# Patient Record
Sex: Female | Born: 1975 | Race: White | Hispanic: No | Marital: Married | Smoking: Former smoker
Health system: Southern US, Community
[De-identification: ages and names within clinical notes are randomized; demographics above are authoritative.]

## PROBLEM LIST (undated history)

## (undated) ENCOUNTER — Inpatient Hospital Stay (HOSPITAL_COMMUNITY): Payer: Self-pay

## (undated) DIAGNOSIS — D649 Anemia, unspecified: Secondary | ICD-10-CM

## (undated) HISTORY — PX: WISDOM TOOTH EXTRACTION: SHX21

## (undated) HISTORY — PX: TONSILLECTOMY: SUR1361

---

## 2010-01-26 ENCOUNTER — Ambulatory Visit (HOSPITAL_COMMUNITY)
Admission: RE | Admit: 2010-01-26 | Discharge: 2010-01-26 | Payer: Self-pay | Source: Home / Self Care | Admitting: Obstetrics and Gynecology

## 2010-03-06 NOTE — L&D Delivery Note (Signed)
Delivery Note 2000 - C/C/+2 2010 - start active 2nd stage. Pushing in SF and L lateral, CNM continuous support at BS FHR 2nd stage Cat 1-2, BL 155, variable decels w/ nadir 90-100  At 9:00 PM a viable female was delivered via Vaginal, Spontaneous Delivery (Presentation: Left Occiput Anterior).  APGAR: 8, 8; weight 7 lb 2.3 oz (3240 g).   Placenta status: Intact, Spontaneous. Placenta to pathology - infant NICU admit.  Cytotec 800 mch PR given preventively for long labor and chronic IDA. Cord: 3 vessels with the following complications: Short.  Cord pH: 7.23 Cord blood donation collected. Cord clamped and cut by FOB.   Anesthesia: Epidural  Episiotomy: None Lacerations: 2nd degree Suture Repair: 3.0 vicryl vicryl rapide Est. Blood Loss (mL): 300  Mom to postpartum.  Baby to NICU - retractions.  Nicanor Mendolia 02/05/2011, 9:53 PM

## 2010-07-15 LAB — ABO/RH

## 2010-07-15 LAB — HEPATITIS B SURFACE ANTIGEN: Hepatitis B Surface Ag: NEGATIVE

## 2010-07-15 LAB — RUBELLA ANTIBODY, IGM: Rubella: IMMUNE

## 2010-07-15 LAB — RPR: RPR: NONREACTIVE

## 2010-11-03 ENCOUNTER — Inpatient Hospital Stay (HOSPITAL_COMMUNITY): Payer: BC Managed Care – PPO

## 2010-11-03 ENCOUNTER — Inpatient Hospital Stay (HOSPITAL_COMMUNITY)
Admission: AD | Admit: 2010-11-03 | Discharge: 2010-11-03 | Disposition: A | Discharge status: Home / Self Care | Payer: BC Managed Care – PPO | Source: Ambulatory Visit | Attending: Obstetrics and Gynecology | Admitting: Obstetrics and Gynecology

## 2010-11-03 ENCOUNTER — Encounter (HOSPITAL_COMMUNITY): Payer: Self-pay | Admitting: *Deleted

## 2010-11-03 DIAGNOSIS — R319 Hematuria, unspecified: Secondary | ICD-10-CM | POA: Diagnosis present

## 2010-11-03 DIAGNOSIS — N200 Calculus of kidney: Secondary | ICD-10-CM

## 2010-11-03 DIAGNOSIS — O26839 Pregnancy related renal disease, unspecified trimester: Secondary | ICD-10-CM | POA: Insufficient documentation

## 2010-11-03 LAB — URINALYSIS, ROUTINE W REFLEX MICROSCOPIC
Ketones, ur: NEGATIVE mg/dL
Nitrite: NEGATIVE
Protein, ur: NEGATIVE mg/dL
pH: 6.5 (ref 5.0–8.0)

## 2010-11-03 LAB — WET PREP, GENITAL: Yeast Wet Prep HPF POC: NONE SEEN

## 2010-11-03 LAB — URINE MICROSCOPIC-ADD ON

## 2010-11-03 MED ORDER — SULFAMETHOXAZOLE-TMP DS 800-160 MG PO TABS
1.0000 | ORAL_TABLET | Freq: Two times a day (BID) | ORAL | Status: DC
  Administered 2010-11-03: 1 via ORAL
  Filled 2010-11-03: qty 1

## 2010-11-03 MED ORDER — SULFAMETHOXAZOLE-TMP DS 800-160 MG PO TABS: 1.0000 | ORAL_TABLET | Freq: Two times a day (BID) | ORAL | Status: AC

## 2010-11-03 NOTE — ED Provider Notes (Signed)
History   SUBJECTIVE:  Destiny Wells is a 34 y.o. female G1P0 at [redacted]w[redacted]d who presents with sudden onset of R flank pain and blood in urine. Noted blood early morning when going to bathroom, some pelvic discomfort, but denies contractions. Good fetal movement. Rates pain 4/10 in her back, now mostly gone. Denies fever, chills. No hx of kidney stones or pyelo, but strong FHx of kidney stones (brother, father). Denies dysuria or frequency.  Chief Complaint  Patient presents with  . Hematuria   No past surgical history on file.  No family history on file.  History  Substance Use Topics  . Smoking status: Not on file  . Smokeless tobacco: Not on file  . Alcohol Use: Not on file    Allergies: No Known Allergies  Prescriptions prior to admission  Medication Sig Dispense Refill  . prenatal vitamin w/FE, FA (PRENATAL 1 + 1) 27-1 MG TABS Take 1 tablet by mouth daily.          ROS neg except as noted. Physical Exam   Blood pressure 128/75, pulse 73, temperature 98.3 F (36.8 C), temperature source Oral, resp. rate 20, height 5\' 7"  (1.702 m), weight 138 lb 8 oz (62.823 kg), last menstrual period 05/13/2010. FHR: 150 BPM, mod variability, accel's present, no decel's Toco: mild irritability with 20-30sec ctx noted on EFM, palpated mild, denies pain  Gen: AAOx3, NAD CV: RRR Pulm: CTAB Abd: soft, NT, gravid, S=D, no CVAT GU: SSE with nl mucous memb, no VB, moderate amt. White creamy D/C noted, wet prep collected  cvx closed/thick/high Ext: no edema   ED Course  UA with large blood, spec grav < 1.005 Renal sono with multiple calculi bilateral, largest stone in R kidney 0.38 x 0.27 x 0.37 cm and debris within bladder Single dilated calyx on left Diminished ureteral jet on right compared to left  MDM IUP at [redacted]w[redacted]d with hematuria kidney stones, no evidence of pyelo  UCX pending Plan d/c home as patient stable at this time, pain minimal Will continue to monitor, advised push PO fluids,  rest today Will give course of ABX Bactrim prophylactic  To call if fever, chills, pain Consult OB on call re further management Strain all urine F/U in office as scheduled or sooner if above develops.  Cassian Torelli 11/03/2010 6:35 AM

## 2010-11-03 NOTE — Progress Notes (Signed)
Pt states that she noticed she has blood in her urine-denies bleeding vaginally

## 2010-11-04 LAB — URINE CULTURE
Colony Count: NO GROWTH
Culture  Setup Time: 201208301043
Culture: NO GROWTH

## 2011-01-19 LAB — STREP B DNA PROBE: GBS: NEGATIVE

## 2011-02-05 ENCOUNTER — Encounter (HOSPITAL_COMMUNITY): Payer: Self-pay | Admitting: Anesthesiology

## 2011-02-05 ENCOUNTER — Inpatient Hospital Stay (HOSPITAL_COMMUNITY): Payer: BC Managed Care – PPO | Admitting: Anesthesiology

## 2011-02-05 ENCOUNTER — Other Ambulatory Visit: Payer: Self-pay

## 2011-02-05 ENCOUNTER — Inpatient Hospital Stay (HOSPITAL_COMMUNITY)
Admission: AD | Admit: 2011-02-05 | Discharge: 2011-02-08 | DRG: 373 | Disposition: A | Discharge status: Home / Self Care | Payer: BC Managed Care – PPO | Source: Ambulatory Visit | Attending: Obstetrics and Gynecology | Admitting: Obstetrics and Gynecology

## 2011-02-05 ENCOUNTER — Encounter (HOSPITAL_COMMUNITY): Payer: Self-pay | Admitting: *Deleted

## 2011-02-05 ENCOUNTER — Encounter (HOSPITAL_COMMUNITY): Payer: Self-pay | Admitting: Obstetrics

## 2011-02-05 DIAGNOSIS — O9903 Anemia complicating the puerperium: Secondary | ICD-10-CM | POA: Diagnosis not present

## 2011-02-05 DIAGNOSIS — D62 Acute posthemorrhagic anemia: Secondary | ICD-10-CM | POA: Diagnosis not present

## 2011-02-05 DIAGNOSIS — O09519 Supervision of elderly primigravida, unspecified trimester: Secondary | ICD-10-CM | POA: Diagnosis present

## 2011-02-05 LAB — CBC
HCT: 29.3 % — ABNORMAL LOW (ref 36.0–46.0)
MCH: 30.1 pg (ref 26.0–34.0)
MCHC: 34.5 g/dL (ref 30.0–36.0)
MCV: 87.5 fL (ref 78.0–100.0)
RDW: 13.8 % (ref 11.5–15.5)

## 2011-02-05 MED ORDER — ONDANSETRON HCL 4 MG/2ML IJ SOLN: 4.0000 mg | INTRAMUSCULAR | Status: DC | PRN

## 2011-02-05 MED ORDER — FLEET ENEMA 7-19 GM/118ML RE ENEM: 1.0000 | ENEMA | Freq: Every day | RECTAL | Status: DC | PRN

## 2011-02-05 MED ORDER — FLEET ENEMA 7-19 GM/118ML RE ENEM: 1.0000 | ENEMA | RECTAL | Status: DC | PRN

## 2011-02-05 MED ORDER — EPHEDRINE 5 MG/ML INJ: 10.0000 mg | INTRAVENOUS | Status: DC | PRN

## 2011-02-05 MED ORDER — BENZOCAINE-MENTHOL 20-0.5 % EX AERO
1.0000 "application " | INHALATION_SPRAY | CUTANEOUS | Status: DC | PRN
  Administered 2011-02-06: 1 via TOPICAL

## 2011-02-05 MED ORDER — ONDANSETRON HCL 4 MG PO TABS: 4.0000 mg | ORAL_TABLET | ORAL | Status: DC | PRN

## 2011-02-05 MED ORDER — LIDOCAINE HCL (PF) 1 % IJ SOLN
30.0000 mL | INTRAMUSCULAR | Status: DC | PRN
  Filled 2011-02-05: qty 30

## 2011-02-05 MED ORDER — FENTANYL 2.5 MCG/ML BUPIVACAINE 1/10 % EPIDURAL INFUSION (WH - ANES)
INTRAMUSCULAR | Status: DC | PRN
  Administered 2011-02-05: 14 mL/h via EPIDURAL

## 2011-02-05 MED ORDER — PHENYLEPHRINE 40 MCG/ML (10ML) SYRINGE FOR IV PUSH (FOR BLOOD PRESSURE SUPPORT): 80.0000 ug | PREFILLED_SYRINGE | INTRAVENOUS | Status: DC | PRN

## 2011-02-05 MED ORDER — TETANUS-DIPHTH-ACELL PERTUSSIS 5-2.5-18.5 LF-MCG/0.5 IM SUSP
0.5000 mL | Freq: Once | INTRAMUSCULAR | Status: DC
  Filled 2011-02-05: qty 0.5

## 2011-02-05 MED ORDER — IBUPROFEN 600 MG PO TABS
600.0000 mg | ORAL_TABLET | Freq: Four times a day (QID) | ORAL | Status: DC
  Administered 2011-02-06 – 2011-02-08 (×9): 600 mg via ORAL
  Filled 2011-02-05 (×9): qty 1

## 2011-02-05 MED ORDER — OXYTOCIN 20 UNITS IN LACTATED RINGERS INFUSION - SIMPLE: 1.0000 m[IU]/min | INTRAVENOUS | Status: DC

## 2011-02-05 MED ORDER — DIBUCAINE 1 % RE OINT: 1.0000 "application " | TOPICAL_OINTMENT | RECTAL | Status: DC | PRN

## 2011-02-05 MED ORDER — OXYTOCIN 20 UNITS IN LACTATED RINGERS INFUSION - SIMPLE
125.0000 mL/h | Freq: Once | INTRAVENOUS | Status: AC
  Administered 2011-02-05: 999 mL/h via INTRAVENOUS

## 2011-02-05 MED ORDER — ACETAMINOPHEN 325 MG PO TABS: 650.0000 mg | ORAL_TABLET | ORAL | Status: DC | PRN

## 2011-02-05 MED ORDER — ZOLPIDEM TARTRATE 5 MG PO TABS
5.0000 mg | ORAL_TABLET | Freq: Every evening | ORAL | Status: DC | PRN
Start: 2011-02-05 — End: 2011-02-08

## 2011-02-05 MED ORDER — OXYCODONE-ACETAMINOPHEN 5-325 MG PO TABS
1.0000 | ORAL_TABLET | ORAL | Status: DC | PRN
  Administered 2011-02-06: 1 via ORAL
  Filled 2011-02-05: qty 1

## 2011-02-05 MED ORDER — TERBUTALINE SULFATE 1 MG/ML IJ SOLN: 0.2500 mg | Freq: Once | INTRAMUSCULAR | Status: DC | PRN

## 2011-02-05 MED ORDER — PHENYLEPHRINE 40 MCG/ML (10ML) SYRINGE FOR IV PUSH (FOR BLOOD PRESSURE SUPPORT)
80.0000 ug | PREFILLED_SYRINGE | INTRAVENOUS | Status: DC | PRN
  Filled 2011-02-05: qty 5

## 2011-02-05 MED ORDER — MISOPROSTOL 200 MCG PO TABS: 800.0000 ug | ORAL_TABLET | Freq: Once | ORAL | Status: DC

## 2011-02-05 MED ORDER — LACTATED RINGERS IV SOLN
500.0000 mL | INTRAVENOUS | Status: DC | PRN
  Administered 2011-02-05: 1000 mL via INTRAVENOUS

## 2011-02-05 MED ORDER — DIPHENHYDRAMINE HCL 50 MG/ML IJ SOLN: 12.5000 mg | INTRAMUSCULAR | Status: DC | PRN

## 2011-02-05 MED ORDER — ONDANSETRON HCL 4 MG/2ML IJ SOLN: 4.0000 mg | Freq: Four times a day (QID) | INTRAMUSCULAR | Status: DC | PRN

## 2011-02-05 MED ORDER — LANOLIN HYDROUS EX OINT: TOPICAL_OINTMENT | CUTANEOUS | Status: DC | PRN

## 2011-02-05 MED ORDER — SENNOSIDES-DOCUSATE SODIUM 8.6-50 MG PO TABS
2.0000 | ORAL_TABLET | Freq: Every day | ORAL | Status: DC
  Administered 2011-02-06 – 2011-02-07 (×2): 2 via ORAL

## 2011-02-05 MED ORDER — FENTANYL 2.5 MCG/ML BUPIVACAINE 1/10 % EPIDURAL INFUSION (WH - ANES)
14.0000 mL/h | INTRAMUSCULAR | Status: DC
  Administered 2011-02-05 (×2): 14 mL/h via EPIDURAL
  Filled 2011-02-05 (×3): qty 60

## 2011-02-05 MED ORDER — DIPHENHYDRAMINE HCL 25 MG PO CAPS: 25.0000 mg | ORAL_CAPSULE | Freq: Four times a day (QID) | ORAL | Status: DC | PRN

## 2011-02-05 MED ORDER — LACTATED RINGERS IV SOLN
INTRAVENOUS | Status: DC
  Administered 2011-02-05: 125 mL/h via INTRAVENOUS
  Administered 2011-02-05: 15:00:00 via INTRAVENOUS

## 2011-02-05 MED ORDER — EPHEDRINE 5 MG/ML INJ
10.0000 mg | INTRAVENOUS | Status: DC | PRN
  Filled 2011-02-05: qty 4

## 2011-02-05 MED ORDER — LACTATED RINGERS IV SOLN: 500.0000 mL | Freq: Once | INTRAVENOUS | Status: DC

## 2011-02-05 MED ORDER — PRENATAL PLUS 27-1 MG PO TABS
1.0000 | ORAL_TABLET | Freq: Every day | ORAL | Status: DC
  Administered 2011-02-06 – 2011-02-08 (×3): 1 via ORAL
  Filled 2011-02-05 (×3): qty 1

## 2011-02-05 MED ORDER — IBUPROFEN 600 MG PO TABS: 600.0000 mg | ORAL_TABLET | Freq: Four times a day (QID) | ORAL | Status: DC | PRN

## 2011-02-05 MED ORDER — SIMETHICONE 80 MG PO CHEW: 80.0000 mg | CHEWABLE_TABLET | ORAL | Status: DC | PRN

## 2011-02-05 MED ORDER — WITCH HAZEL-GLYCERIN EX PADS: 1.0000 "application " | MEDICATED_PAD | CUTANEOUS | Status: DC | PRN

## 2011-02-05 MED ORDER — OXYTOCIN 20 UNITS IN LACTATED RINGERS INFUSION - SIMPLE
1.0000 m[IU]/min | INTRAVENOUS | Status: DC
  Administered 2011-02-05: 1 m[IU]/min via INTRAVENOUS
  Filled 2011-02-05: qty 1000

## 2011-02-05 MED ORDER — LIDOCAINE HCL 1.5 % IJ SOLN
INTRAMUSCULAR | Status: DC | PRN
  Administered 2011-02-05 (×2): 4 mL via EPIDURAL

## 2011-02-05 MED ORDER — CITRIC ACID-SODIUM CITRATE 334-500 MG/5ML PO SOLN: 30.0000 mL | ORAL | Status: DC | PRN

## 2011-02-05 MED ORDER — OXYTOCIN BOLUS FROM INFUSION
500.0000 mL | Freq: Once | INTRAVENOUS | Status: DC
  Filled 2011-02-05: qty 500

## 2011-02-05 MED ORDER — BISACODYL 10 MG RE SUPP: 10.0000 mg | Freq: Every day | RECTAL | Status: DC | PRN

## 2011-02-05 MED ORDER — OXYCODONE-ACETAMINOPHEN 5-325 MG PO TABS: 2.0000 | ORAL_TABLET | ORAL | Status: DC | PRN

## 2011-02-05 MED ORDER — MISOPROSTOL 200 MCG PO TABS
ORAL_TABLET | ORAL | Status: AC
  Administered 2011-02-05: 800 ug via RECTAL
  Filled 2011-02-05: qty 4

## 2011-02-05 NOTE — Progress Notes (Signed)
Discussed MD recommendations for pitocin administration--pt refuses at this time to have labor augmentation measures performed

## 2011-02-05 NOTE — Anesthesia Procedure Notes (Signed)
Epidural Patient location during procedure: OB Start time: 02/05/2011 12:14 PM  Staffing Anesthesiologist: Abagale Boulos A. Performed by: anesthesiologist   Preanesthetic Checklist Completed: patient identified, site marked, surgical consent, pre-op evaluation, timeout performed, IV checked, risks and benefits discussed and monitors and equipment checked  Epidural Patient position: sitting Prep: site prepped and draped and DuraPrep Patient monitoring: continuous pulse ox and blood pressure Approach: midline Injection technique: LOR air  Needle:  Needle type: Tuohy  Needle gauge: 17 G Needle length: 9 cm Needle insertion depth: 4 cm Catheter type: closed end flexible Catheter size: 19 Gauge Catheter at skin depth: 9 cm Test dose: negative and 1.5% lidocaine  Assessment Events: blood not aspirated, injection not painful, no injection resistance, negative IV test and no paresthesia  Additional Notes Patient is more comfortable after epidural dosed. Please see RN's note for documentation of vital signs and FHR which are stable.

## 2011-02-05 NOTE — Progress Notes (Signed)
Destiny Wells is a 35 y.o. G1P0 at [redacted]w[redacted]d by LMP admitted for rupture of membranes at 1800 on 12/1.  Subjective: Painful contractions   Objective: BP 131/84  Pulse 81  Temp(Src) 98.1 F (36.7 C) (Oral)  Resp 20  LMP 05/13/2010      FHT:  FHR: 155 bpm, variability: moderate,  accelerations:  Present,  decelerations:  Absent UC:   irregular, every 3-7 minutes SVE:   Dilation: Fingertip Effacement (%): 70 Station: -2 Exam by:: Lilli Few, RN  Labs: Lab Results  Component Value Date   WBC 10.1 02/05/2011   HGB 10.1* 02/05/2011   HCT 29.3* 02/05/2011   MCV 87.5 02/05/2011   PLT 171 02/05/2011    Assessment / Plan: Spontaneous labor, progressing normally  Labor: Progressing normally Preeclampsia:  na Fetal Wellbeing:  Category I Pain Control:  Labor support without medications I/D:  n/a Anticipated MOD:  NSVD  Merrie Epler J 02/05/2011, 9:10 AM

## 2011-02-05 NOTE — Progress Notes (Signed)
Dr. Billy Coast notified of triage pt in L & D here for R/O SROM. Informed of +Fern, sve, fetal well being, UC pattern and pt's plans for water birth and birth plan. MD recommends that pt be started on pitocin; will see if pt is agreeable and notify MD.

## 2011-02-05 NOTE — Progress Notes (Signed)
S: Doing well, pain controlled with  Epidural. Occasional pelvic pressure present. Very tired, no rest yet.    OCeasar Mons Vitals:   02/05/11 1701 02/05/11 1731 02/05/11 1801 02/05/11 1830  BP: 138/74 114/78 131/80 135/75  Pulse: 82 110 78 81  Temp:  98.6 F (37 C) 99.1 F (37.3 C)   TempSrc:  Oral Oral   Resp: 18 18 18 18   Height:      Weight:      SpO2:       Pitocin @ 2 mu/min  FHT:  FHR: 155 bpm, variability: moderate,  accelerations:  Present,  decelerations:  Absent UC:   regular, every 2-3 minutes SVE:   Dilation: 5 Effacement (%): 100 Station: -1 Exam by:: Arlan Organ, CNM No caput or molding, (+) show, vertex OA  A / P: Augmentation of labor, protracted labor, SROM x 24 hrs, GBS neg, no S/S chorio Increase Pitocin by 2 mu q 30 min  Fetal Wellbeing:  Category I Pain Control:  Epidural  Anticipated MOD:  NSVD CNM resumed care.   Destiny Wells 02/05/2011, 6:50 PM

## 2011-02-05 NOTE — Anesthesia Preprocedure Evaluation (Signed)
Anesthesia Evaluation  Patient identified by MRN, date of birth, ID band Patient awake    Reviewed: Allergy & Precautions, H&P , Patient's Chart, lab work & pertinent test results  Airway Mallampati: III TM Distance: >3 FB Neck ROM: full    Dental No notable dental hx. (+) Teeth Intact   Pulmonary neg pulmonary ROS,  clear to auscultation  Pulmonary exam normal       Cardiovascular neg cardio ROS regular Normal    Neuro/Psych Negative Neurological ROS  Negative Psych ROS   GI/Hepatic negative GI ROS, Neg liver ROS,   Endo/Other  Negative Endocrine ROS  Renal/GU negative Renal ROS  Genitourinary negative   Musculoskeletal   Abdominal   Peds  Hematology negative hematology ROS (+)   Anesthesia Other Findings   Reproductive/Obstetrics (+) Pregnancy                           Anesthesia Physical Anesthesia Plan  ASA: II  Anesthesia Plan: Epidural   Post-op Pain Management:    Induction:   Airway Management Planned:   Additional Equipment:   Intra-op Plan:   Post-operative Plan:   Informed Consent: I have reviewed the patients History and Physical, chart, labs and discussed the procedure including the risks, benefits and alternatives for the proposed anesthesia with the patient or authorized representative who has indicated his/her understanding and acceptance.     Plan Discussed with: Anesthesiologist and Surgeon  Anesthesia Plan Comments:         Anesthesia Quick Evaluation

## 2011-02-05 NOTE — H&P (Signed)
NAMEOVA, GILLENTINE NO.:  [id number]  MEDICAL RECORD NO.:  [id number]  LOCATION:  9162                          FACILITY:  [facility]  PHYSICIAN:  Lenoard Aden, M.D.DATE OF BIRTH:  Jul 31, 1975  DATE OF ADMISSION:  02/05/2011 DATE OF DISCHARGE:                             HISTORY & PHYSICAL   CHIEF COMPLAINT:  Spontaneous rupture of membranes at 6 p.m. on February 04, 2011.  HISTORY OF PRESENT ILLNESS:  She is a 35 year old white female, G1, P0, at 53 2/7th weeks gestation who presents with spontaneous rupture of membranes and increased frequency of contractions.  GBS negative.  ALLERGIES:  She has no known drug allergies.  MEDICATIONS:  Prenatal vitamins.  SOCIAL HISTORY:  Noncontributory.  FAMILY HISTORY:  Hypertension, kidney stones, lung cancer, diabetes.  SURGICAL HISTORY:  Remarkable for wisdom tooth removal.  PAST MEDICAL HISTORY:  Otherwise, noncontributory.  OBSTETRIC HISTORY:  Noncontributory.  Current pregnancy complicated by an abnormal 1 hour GTT and declined 3 hour GTT with home glucose monitoring done and reportedly within normal limits.  PHYSICAL EXAMINATION:  GENERAL:  She is a well-developed, well- nourished, white female, in no acute distress.  HEENT:  Normal. NECK:  Supple.  Full range of motion. LUNGS:  Clear. HEART:  Regular rhythm. ABDOMEN:  Soft, gravid, nontender.  Estimated fetal weight is about 7.5 pounds.  Cervix per RN is 1, 50% vertex and -1. EXTREMITIES:  No cords. NEUROLOGIC:  Nonfocal. SKIN:  Intact.  IMPRESSION:  Term intrauterine pregnancy with spontaneous rupture of membranes, now x14 hours GBS negative.  PLAN:  Anticipate attempts at water birth per patient's request. Intermittent monitoring, pain relief as needed.     Lenoard Aden, M.D.     RJT/MEDQ  D:  02/05/2011  T:  02/05/2011  Job:  [id number]

## 2011-02-05 NOTE — Progress Notes (Signed)
Pt in tub--temperature 99.8 degrees

## 2011-02-05 NOTE — Progress Notes (Signed)
Destiny Wells is a 35 y.o. G1P0 at [redacted]w[redacted]d by LMP admitted for active labor, rupture of membranes at term.   Subjective: Comfortable with Epidural  Objective: BP 138/74  Pulse 71  Temp(Src) 97.8 F (36.6 C) (Oral)  Resp 18  Ht 5\' 7"  (1.702 m)  Wt 62.596 kg (138 lb)  BMI 21.61 kg/m2  SpO2 99%  LMP 05/13/2010   Total I/O In: -  Out: 950 [Urine:950]  FHT:  FHR: 155 bpm, variability: moderate,  accelerations:  Present,  decelerations:  Absent UC:   regular, every 2-3 minutes SVE:   4-5/90/0  Labs: Lab Results  Component Value Date   WBC 10.1 02/05/2011   HGB 10.1* 02/05/2011   HCT 29.3* 02/05/2011   MCV 87.5 02/05/2011   PLT 171 02/05/2011    Assessment / Plan: Augmentation of labor, progressing well with SROM x 22 hrs GBS neg- no s/s Chorioamnionitis  Labor: Progressing normally on Pitocin augmentation Preeclampsia:  na Fetal Wellbeing:  Category I Pain Control:  Epidural I/D:  n/a Anticipated MOD:  NSVD  Destiny Wells 02/05/2011, 3:53 PM

## 2011-02-05 NOTE — Progress Notes (Signed)
S:  Epidural effective w/ PCEA use, pelvic pressure and hip pain present, midwife support at bedside. Repositioned frequently L and R exaggerated sims.   O: BP 135/68  Pulse 74  Temp(Src) 98.3 F (36.8 C) (Oral)  Resp 20  Ht 5\' 7"  (1.702 m)  Wt 62.596 kg (138 lb)  BMI 21.61 kg/m2  SpO2 99%  LMP 05/13/2010   FHT:  FHR: 155 bpm, variability: moderate,  accelerations:  Present,  decelerations:  Occasional variables relieved w/ repositioning UC:   regular, every 2-4 minutes, occasional coupling  SVE:   Dilation: Lip/rim Effacement (%): 100 Station: +1 Exam by:: Arlan Organ CNM   A / P: Augmentation of labor, progressing well  Fetal Wellbeing:  Category I Pain Control:  Epidural  Anticipated MOD:  NSVD Laboring down, anticipating second stage next 30-60 min.  PAUL,DANIELA 02/05/2011, 7:32 PM

## 2011-02-06 ENCOUNTER — Encounter (HOSPITAL_COMMUNITY): Payer: Self-pay | Admitting: Obstetrics and Gynecology

## 2011-02-06 LAB — CBC
HCT: 24.7 % — ABNORMAL LOW (ref 36.0–46.0)
Hemoglobin: 8.7 g/dL — ABNORMAL LOW (ref 12.0–15.0)
MCV: 87.6 fL (ref 78.0–100.0)
RBC: 2.82 MIL/uL — ABNORMAL LOW (ref 3.87–5.11)
RDW: 13.8 % (ref 11.5–15.5)
WBC: 15 10*3/uL — ABNORMAL HIGH (ref 4.0–10.5)

## 2011-02-06 LAB — CORD BLOOD GAS (ARTERIAL)

## 2011-02-06 LAB — ABO/RH: ABO/RH(D): B NEG

## 2011-02-06 MED ORDER — POLYSACCHARIDE IRON 150 MG PO CAPS
150.0000 mg | ORAL_CAPSULE | Freq: Every day | ORAL | Status: DC
  Administered 2011-02-06 – 2011-02-08 (×3): 150 mg via ORAL
  Filled 2011-02-06 (×4): qty 1

## 2011-02-06 MED ORDER — RHO D IMMUNE GLOBULIN 1500 UNIT/2ML IJ SOLN
300.0000 ug | Freq: Once | INTRAMUSCULAR | Status: AC
  Administered 2011-02-06: 300 ug via INTRAMUSCULAR
  Filled 2011-02-06: qty 2

## 2011-02-06 MED ORDER — DOCUSATE SODIUM 100 MG PO CAPS
100.0000 mg | ORAL_CAPSULE | Freq: Every day | ORAL | Status: DC
  Administered 2011-02-06 – 2011-02-08 (×3): 100 mg via ORAL
  Filled 2011-02-06 (×3): qty 1

## 2011-02-06 MED ORDER — BENZOCAINE-MENTHOL 20-0.5 % EX AERO
INHALATION_SPRAY | CUTANEOUS | Status: AC
  Administered 2011-02-06: 1 via TOPICAL
  Filled 2011-02-06: qty 56

## 2011-02-06 MED ORDER — MENTHOL 3 MG MT LOZG
1.0000 | LOZENGE | OROMUCOSAL | Status: DC | PRN
  Filled 2011-02-06: qty 9

## 2011-02-06 NOTE — Anesthesia Postprocedure Evaluation (Signed)
  Anesthesia Post-op Note  Patient: Destiny Wells  Procedure(s) Performed: * No procedures listed *  Patient Location: [facility]  Anesthesia Type: Epidural  Level of Consciousness: alert  and oriented  Airway and Oxygen Therapy: Patient Spontanous Breathing  Post-op Pain: mild  Post-op Assessment: Patient's Cardiovascular Status Stable and Respiratory Function Stable  Post-op Vital Signs: stable  Complications: No apparent anesthesia complications

## 2011-02-06 NOTE — Progress Notes (Signed)
UR chart review completed.  

## 2011-02-06 NOTE — Progress Notes (Signed)
Provided pt with Tdap vaccination sheet. After reading pt decided she would like to receive vaccination at doctors office

## 2011-02-06 NOTE — Progress Notes (Signed)
Patient ID: Destiny Wells, female   DOB: [birth year], 35 y.o.   MRN: [id number]  PPD 1 SVD  S:  Reports feeling well - tired             Tolerating po/ No nausea or vomiting             Bleeding is moderate             Pain controlled withprescription NSAID's including motrin             Up ad lib / ambulatory  Newborn breast feeding  / newborn in NICU  O:  A & O x 3 NAd             VS: Blood pressure 120/71, pulse 58, temperature 97.8 F (36.6 C), temperature source Oral, resp. rate 18, height 5\' 7"  (1.702 m), weight 62.596 kg (138 lb), last menstrual period 05/13/2010, SpO2 98.00%, unknown if currently breastfeeding.  LABS: Lab Results  Component Value Date   WBC 15.0* 02/06/2011   HGB 8.7* 02/06/2011   HCT 24.7* 02/06/2011   MCV 87.6 02/06/2011   PLT 153 02/06/2011     I&O: I/O last 3 completed shifts: In: -  Out: 2250 [Urine:1950; Blood:300]      Lungs: Clear and unlabored  Heart: regular rate and rhythm / no mumurs  Abdomen: soft, non-tender, non-distended              Fundus: firm, non-tender, U-1  Perineum: moderate edema / ice pack in place  Lochia: moderate  Extremities: trace edema, no calf pain or tenderness    A: PPD # 1              Acute blood loss anemia  Doing well - stable status  P:  Routine post partum orders             Start iron and colace    Saim Almanza 02/06/2011, 8:48 AM

## 2011-02-07 LAB — RH IG WORKUP (INCLUDES ABO/RH)
ABO/RH(D): B NEG
Antibody Screen: POSITIVE
Fetal Screen: NEGATIVE

## 2011-02-07 NOTE — Progress Notes (Signed)
SW spoke to Dr. Ransom/Neonatalogist regarding today's plan for baby.  He informed SW that it is unlikely that the baby would be ready for d/c tomorrow so his hope is that parents may be able to room in tomorrow night, with a possible discharge on Thursday.  SW informed Daniela P./CNM.   

## 2011-02-07 NOTE — Progress Notes (Signed)
PSYCHOSOCIAL ASSESSMENT ~ MATERNAL/CHILD Name: Destiny Wells                                                                                           Age: 35   Referral Date: 02/07/11 Reason/Source: NICU Support/NICU  I. FAMILY/HOME ENVIRONMENT A. Child's Legal Guardian __x_Parent(s) ___Grandparent ___Foster parent ___DSS_________________ Name: Destiny Wells                                        DOB: 1975/09/20            Age: 29  Address: [address], [place], [place] [address]  Name: Destiny Wells                             DOB: //                     Age:   Address: same  B. Other Household Members/Support Persons Name:                                         Relationship:                        DOB ___/___/___                   Name:                                         Relationship:                        DOB ___/___/___                   Name:                                         Relationship:                        DOB ___/___/___                   Name:                                         Relationship:                        DOB ___/___/___  C. Other Support: Good support system.  MOB states she has requested for her family and friends to visit once they get home rather than while  they are in the hospital.  She states they have been very understanding.   II. PSYCHOSOCIAL DATA A. Information Source                                                                                             _x_Patient Interview  __Family Interview           _x_Other: chart  B. Event organiser __Employment: __Medicaid    [place]:                 _x_Private Insurance: BCBS                  __Self Pay  __Food Stamps   __WIC __[employer]     __Public Housing     __Section 8    __Maternity Care Coordination/Child Service Coordination/Early Intervention  __School:                                                                         Grade:  __Other:   Salena Saner Cultural  and Environment Information Cultural Issues Impacting Care: none known  III. STRENGTHS _x__Supportive family/friends _x__Adequate Resources _x__Compliance with medical plan _x__Home prepared for Child (including basic supplies) _x__Understanding of illness      _x__Other: Pediatrician will be Dr. Roxy Cedar IV. RISK FACTORS AND CURRENT PROBLEMS         __x__No Problems Noted                                                                                                                                                                                                                                       Pt              Family     Substance Abuse  ___              ___        Mental Illness                                                                        ___              ___  Family/Relationship Issues                                      ___               ___             Abuse/Neglect/Domestic Violence                                         ___         ___  Financial Resources                                        ___              ___             Transportation                                                                        ___               ___  DSS Involvement                                                                   ___              ___  Adjustment to Illness                                                               ___              ___  Knowledge/Cognitive Deficit  ___              ___             Compliance with Treatment                                                 ___              ___  Basic Needs (food, housing, etc.)                                          ___              ___             Housing Concerns                                       ___              ___ Other_____________________________________________________________            V. SOCIAL  WORK ASSESSMENT  SW spoke with NNP/Amanda W. before meeting with MOB to see what the plan for d/c is.  Marchelle Folks informed SW that they have not rounded on baby yet.  SW will check back later.  SW met with MOB in her third floor room to introduce myself, complete assessment and evaluate how she is coping with baby's admission to NICU.  MOB was quiet, but very pleasant.  She states she and baby are doing well.  We spoke mostly of baby's unknown discharge and emotions related to NICU admission and MOB's discharge.  MOB was very appropriate and just wants the best for baby.  She states she has a good support system and everything she needs for baby at home.  She reports no questions or needs at this time, and seemed appreciative of SW's visit.  SW explained support services offered by NICU SW and gave contact information.  VI. SOCIAL WORK PLAN  ___No Further Intervention Required/No Barriers to Discharge   __x_Psychosocial Support and Ongoing Assessment of Needs   ___Patient/Family Education:   ___Child Protective Services Report   County___________ Date___/____/____   ___Information/Referral to [employer]_________________________   __x_Other: SW will follow up with NICU team regarding plans for baby's d/c and then follow up with Fausto Skillern Paul/CNM per her request.

## 2011-02-07 NOTE — Progress Notes (Signed)
PPD 2 SVD  S:  Reports feeling tired and emotional.             Tolerating po/ No nausea or vomiting             Bleeding is light             Pain controlled withprescription NSAID's including motrin and narcotic analgesics including percocet x 1             Up ad lib / ambulatory  Newborn  Information for the patient's newborn:  Anahla, Bevis [[id number]]  female  breast feeding and pumping  / Circumcision on hold - infant in NICU, improving, continues on IV ABX but able to breast feed on demand   O:  A & O x 3 , weepy             VS: Blood pressure 121/78, pulse 81, temperature 98 F (36.7 C), temperature source Oral, resp. rate 18, height 5\' 7"  (1.702 m), weight 62.596 kg (138 lb), last menstrual period 05/13/2010, SpO2 98.00%, unknown if currently breastfeeding.  LABS:  Basename 02/06/11 0530 02/05/11 0809  HGB 8.7* 10.1*  HCT 24.7* 29.3*    I&O: I/O last 3 completed shifts: In: -  Out: 300 [Blood:300]      Lungs: Clear and unlabored  Heart: regular rate and rhythm / no mumurs  Abdomen: soft, non-tender, non-distended              Fundus: firm, non-tender, U-2  Perineum: repair intact, (+) ecchymosis, no edema  Lochia: small  Extremities: trace edema, no calf pain or tenderness, neg Homans    A/P: PPD # 2 35 y.o., G1P1001 S/P:spontaneous vaginal   Active Problems:  Postpartum care following vaginal delivery (12/2)  Stable PP  Routine post partum orders ABL anemia superimposed on iron deficiency anemia  Started on oral Fe and colace Emotional lability d/t increased stress of infant in NICU  SW consult, support offered  Continue inpatient for now, will D/C home in AM or sooner if able to room in with infant pending D/C  Destiny Wells, CNM, MSN 02/07/2011, 10:53 AM

## 2011-02-08 MED ORDER — OXYCODONE-ACETAMINOPHEN 5-325 MG PO TABS: 1.0000 | ORAL_TABLET | ORAL | Status: AC | PRN

## 2011-02-08 MED ORDER — IBUPROFEN 600 MG PO TABS: 600.0000 mg | ORAL_TABLET | Freq: Four times a day (QID) | ORAL | Status: AC

## 2011-02-08 MED ORDER — POLYSACCHARIDE IRON 150 MG PO CAPS: 150.0000 mg | ORAL_CAPSULE | Freq: Every day | ORAL | Status: DC

## 2011-02-08 NOTE — Discharge Summary (Signed)
Obstetric Discharge Summary Reason for Admission: onset of labor after SROM Prenatal Procedures: none Intrapartum Procedures: spontaneous vaginal delivery Postpartum Procedures: Rho(D) Ig Complications-Operative and Postpartum: 2nd degree perineal laceration Hemoglobin  Date Value Range Status  02/06/2011 8.7* 12.0-15.0 (g/dL) Final     HCT  Date Value Range Status  02/06/2011 24.7* 36.0-46.0 (%) Final    Discharge Diagnoses: Term Pregnancy-delivered  Discharge Information: Date: 02/08/2011 Activity: pelvic rest Diet: routine Medications: PNV, Ibuprofen, Iron and Percocet Condition: stable Instructions: refer to practice specific booklet Discharge to: home Follow-up Information    Follow up with PAUL,DANIELA. Make an appointment in 6 weeks.   Contact information:   [address] [address] [phone]          Newborn Data: Live born female  Birth Weight: 7 lb 2.3 oz (3240 g) APGAR: 8, 8  Home with mother after tonight - rooming in with newborn in NICU tonight prior 12/6.  BAILEY,TANYA 02/08/2011, 2:42 PM

## 2011-02-08 NOTE — Progress Notes (Addendum)
Patient ID: Destiny Wells, female   DOB: [birth year], 35 y.o.   MRN: [id number]  PPD 3 SVD  S:  Reports feeling better today - no dizziness with ambulation / still fatigued             Tolerating po/ No nausea or vomiting             Bleeding is light             Pain controlled withmotrin             Up ad lib / ambulatory  Newborn pumping for newborn in NICU - milk in today - plans breastfeeding     O:  A & O x 3 NAD / pale             VS: Blood pressure 115/72, pulse 64, temperature 97.4 F (36.3 C), temperature source Oral, resp. rate 18, height 5\' 7"  (1.702 m), weight 62.596 kg (138 lb), last menstrual period 05/13/2010, SpO2 98.00%, unknown if currently breastfeeding.  LABS: Lab Results  Component Value Date   WBC 15.0* 02/06/2011   HGB 8.7* 02/06/2011   HCT 24.7* 02/06/2011   MCV 87.6 02/06/2011   PLT 153 02/06/2011     Lungs: Clear and unlabored             Breasts - pumping / + firm with some mild engorgement  Heart: regular rate and rhythm   Abdomen: soft, non-tender             Fundus: firm, non-tender, U-2  Perineum:ddcreased edema  Lochia: light  Extremities: no edema, no calf pain or tenderness    A: PPD # 3 - NICU admission of newborn             Acute blood loss anemia   Doing well - stable status  P:  Routine post partum orders - continue iron and colace x 6 weeks / maintain good hydration  Anticipate discharge today - unsure until after noon if able to room-in with newborn or whether will discharge to home. Will check with NICU for plan of care on newborn at lunch - make decision on maternal disposition mid-afternoon.  Eve Rey 02/08/2011, 9:44 AM   Baby anticipate for discharge home tomorrow - Mom will room-in with newborn in NICU tonight.

## 2011-02-08 NOTE — Progress Notes (Signed)
Patient and her husband are going to room in with baby in NICU tonight, so they are leaving room 309 with belongings and are taking them downstairs

## 2011-02-09 ENCOUNTER — Encounter (HOSPITAL_COMMUNITY)
Admission: RE | Admit: 2011-02-09 | Discharge: 2011-02-09 | Disposition: A | Discharge status: Home / Self Care | Payer: BC Managed Care – PPO | Source: Ambulatory Visit | Attending: Obstetrics and Gynecology | Admitting: Obstetrics and Gynecology

## 2011-02-09 DIAGNOSIS — O923 Agalactia: Secondary | ICD-10-CM | POA: Insufficient documentation

## 2011-03-13 ENCOUNTER — Encounter (HOSPITAL_COMMUNITY)
Admission: RE | Admit: 2011-03-13 | Discharge: 2011-03-13 | Disposition: A | Discharge status: Home / Self Care | Payer: BC Managed Care – PPO | Source: Ambulatory Visit

## 2011-03-13 DIAGNOSIS — O923 Agalactia: Secondary | ICD-10-CM | POA: Insufficient documentation

## 2011-04-13 ENCOUNTER — Encounter (HOSPITAL_COMMUNITY)
Admission: RE | Admit: 2011-04-13 | Discharge: 2011-04-13 | Disposition: A | Discharge status: Home / Self Care | Payer: BC Managed Care – PPO | Source: Ambulatory Visit

## 2011-04-13 DIAGNOSIS — O923 Agalactia: Secondary | ICD-10-CM | POA: Insufficient documentation

## 2011-05-12 ENCOUNTER — Encounter (HOSPITAL_COMMUNITY)
Admission: RE | Admit: 2011-05-12 | Discharge: 2011-05-12 | Disposition: A | Discharge status: Home / Self Care | Payer: BC Managed Care – PPO | Source: Ambulatory Visit

## 2011-05-12 DIAGNOSIS — O923 Agalactia: Secondary | ICD-10-CM | POA: Insufficient documentation

## 2011-06-12 ENCOUNTER — Encounter (HOSPITAL_COMMUNITY)
Admission: RE | Admit: 2011-06-12 | Discharge: 2011-06-12 | Disposition: A | Discharge status: Home / Self Care | Payer: BC Managed Care – PPO | Source: Ambulatory Visit

## 2011-06-12 DIAGNOSIS — O923 Agalactia: Secondary | ICD-10-CM | POA: Insufficient documentation

## 2011-06-13 ENCOUNTER — Encounter (HOSPITAL_COMMUNITY): Payer: BC Managed Care – PPO

## 2011-07-13 ENCOUNTER — Encounter (HOSPITAL_COMMUNITY)
Admission: RE | Admit: 2011-07-13 | Discharge: 2011-07-13 | Disposition: A | Discharge status: Home / Self Care | Payer: BC Managed Care – PPO | Source: Ambulatory Visit

## 2011-07-13 DIAGNOSIS — O923 Agalactia: Secondary | ICD-10-CM | POA: Insufficient documentation

## 2011-08-13 ENCOUNTER — Encounter (HOSPITAL_COMMUNITY)
Admission: RE | Admit: 2011-08-13 | Discharge: 2011-08-13 | Disposition: A | Discharge status: Home / Self Care | Payer: BC Managed Care – PPO | Source: Ambulatory Visit

## 2011-08-13 DIAGNOSIS — O923 Agalactia: Secondary | ICD-10-CM | POA: Insufficient documentation

## 2011-09-13 ENCOUNTER — Encounter (HOSPITAL_COMMUNITY)
Admission: RE | Admit: 2011-09-13 | Discharge: 2011-09-13 | Disposition: A | Discharge status: Home / Self Care | Payer: BC Managed Care – PPO | Source: Ambulatory Visit

## 2011-09-13 DIAGNOSIS — O923 Agalactia: Secondary | ICD-10-CM | POA: Insufficient documentation

## 2011-10-14 ENCOUNTER — Encounter (HOSPITAL_COMMUNITY)
Admission: RE | Admit: 2011-10-14 | Discharge: 2011-10-14 | Disposition: A | Discharge status: Home / Self Care | Payer: BC Managed Care – PPO | Source: Ambulatory Visit

## 2011-10-14 DIAGNOSIS — O923 Agalactia: Secondary | ICD-10-CM | POA: Insufficient documentation

## 2011-11-14 ENCOUNTER — Encounter (HOSPITAL_COMMUNITY)
Admission: RE | Admit: 2011-11-14 | Discharge: 2011-11-14 | Disposition: A | Discharge status: Home / Self Care | Payer: BC Managed Care – PPO | Source: Ambulatory Visit

## 2011-11-14 DIAGNOSIS — O923 Agalactia: Secondary | ICD-10-CM | POA: Insufficient documentation

## 2011-11-30 IMAGING — US US RENAL
1 series · 14 of 25 positions shown · non-contrast
Comparison: None.

CLINICAL DATA: Hematuria, flank pain. 25 weeks pregnant.

RENAL/URINARY TRACT ULTRASOUND COMPLETE

[Series 1: us renal · 36 acquisitions, 14 frames shown]
[im 1/36]
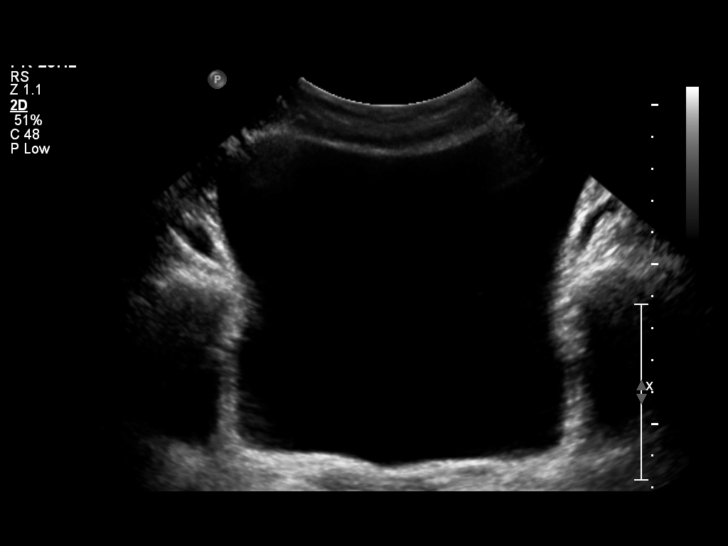
[im 3/36]
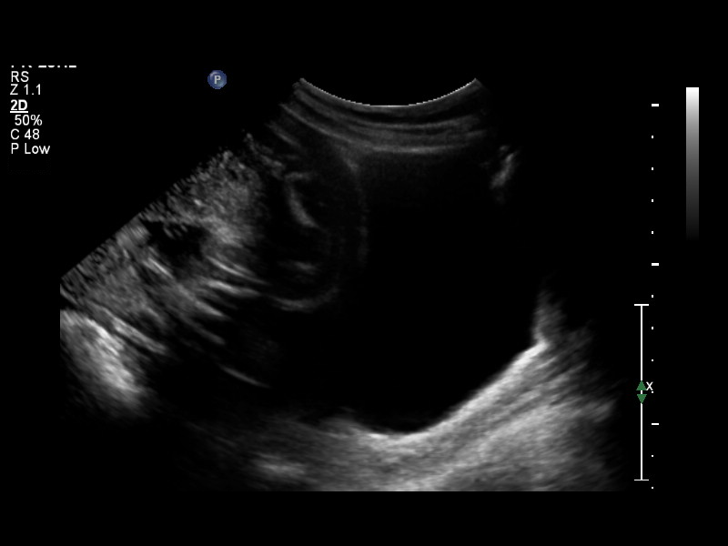
[im 6/36]
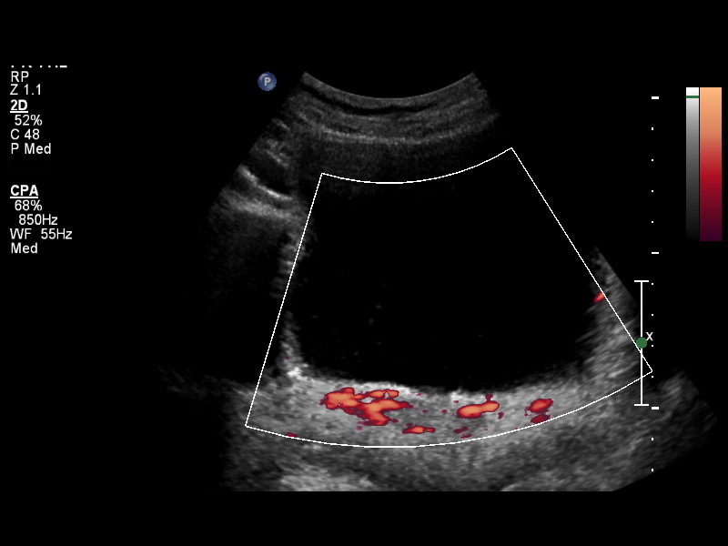
[im 9/36]
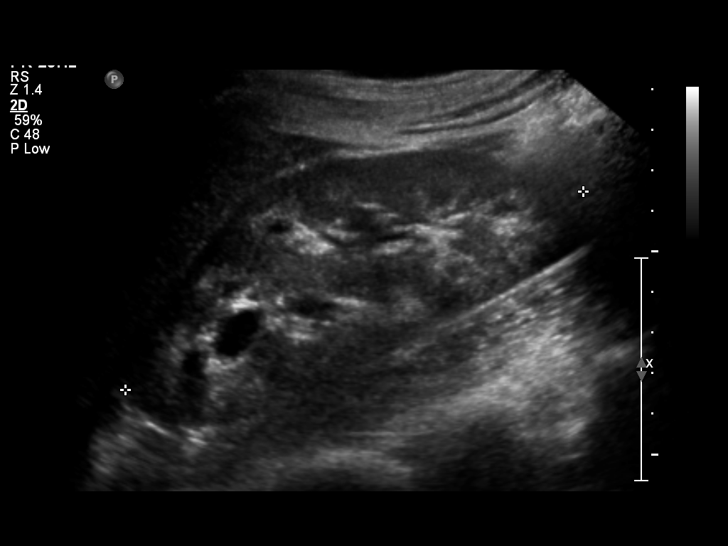
[im 12/36]
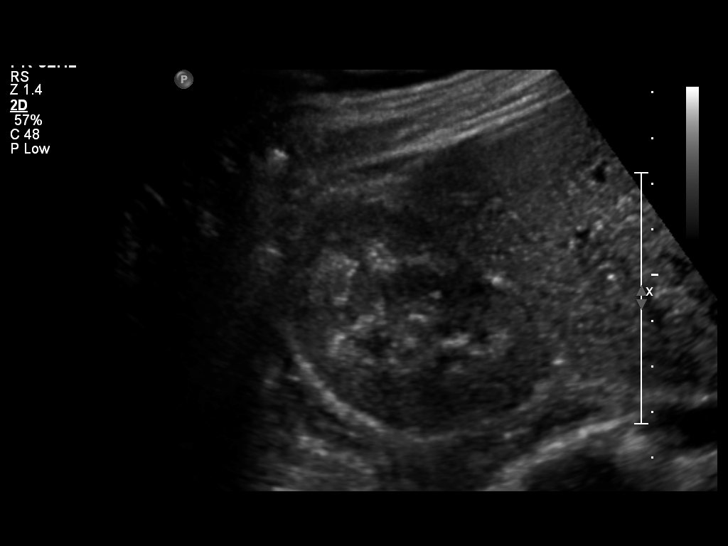
[im 14/36]
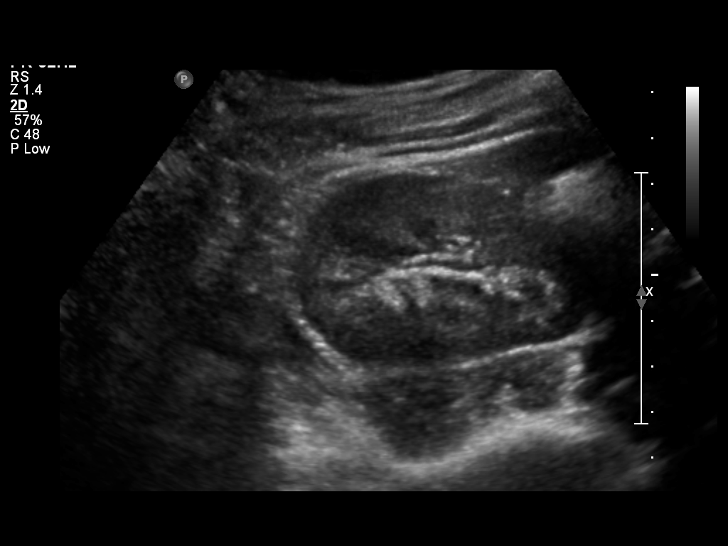
[im 17/36]
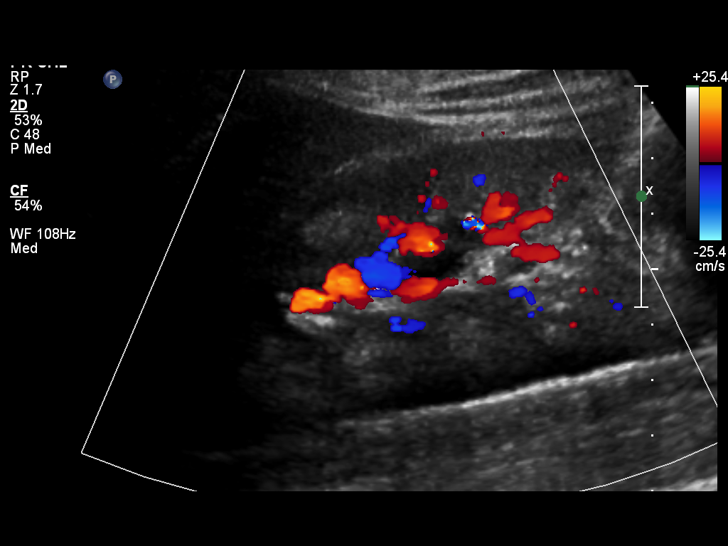
[im 19/36]
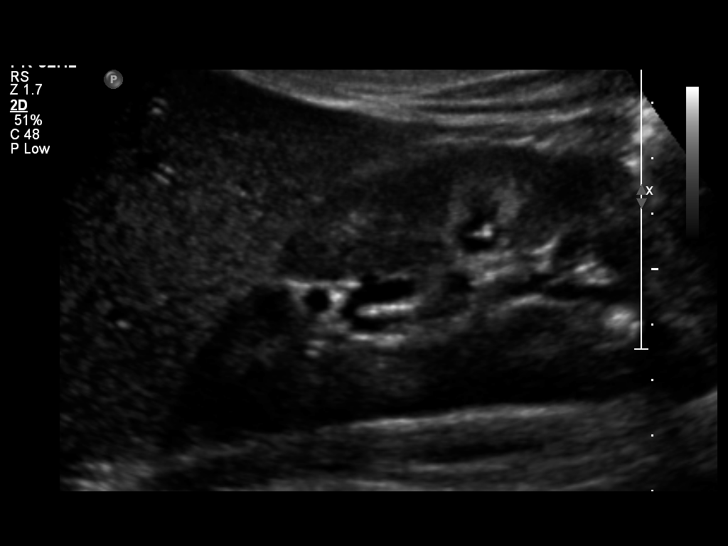
[im 22/36]
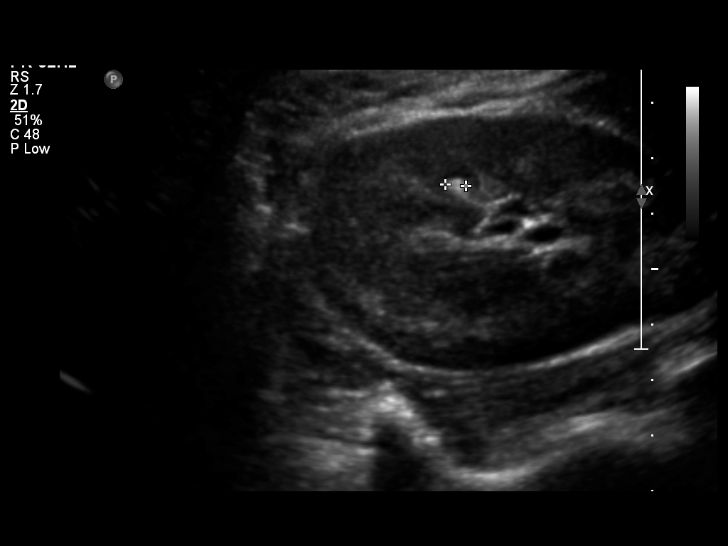
[im 24/36]
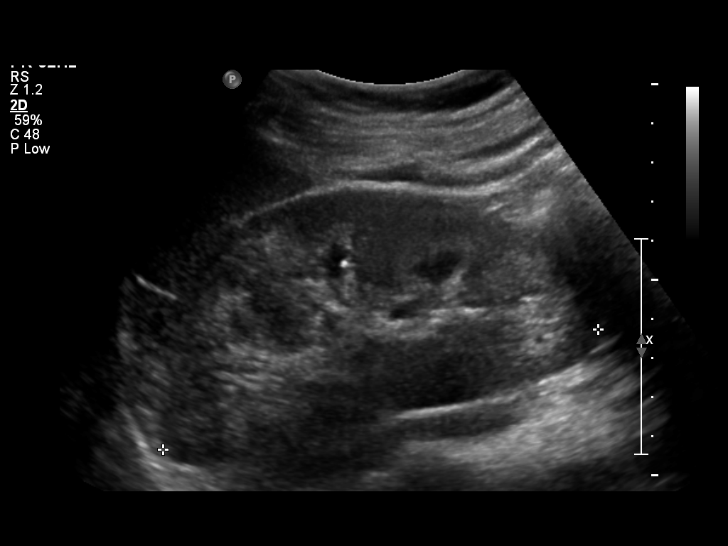
[im 27/36]
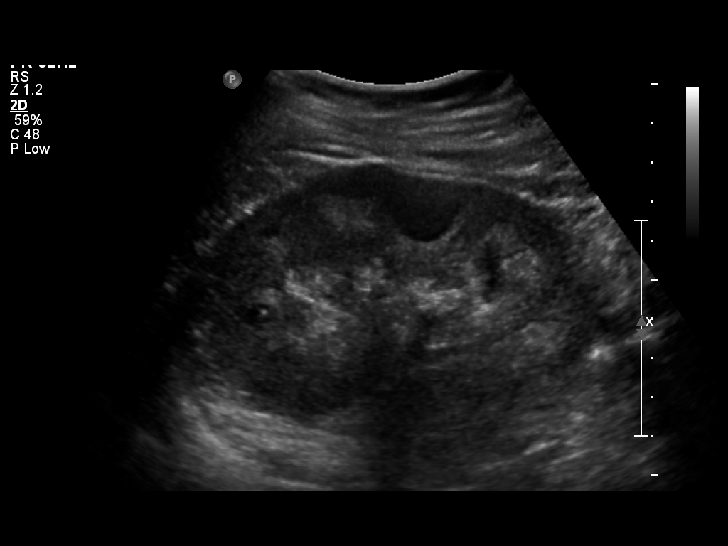
[im 30/36]
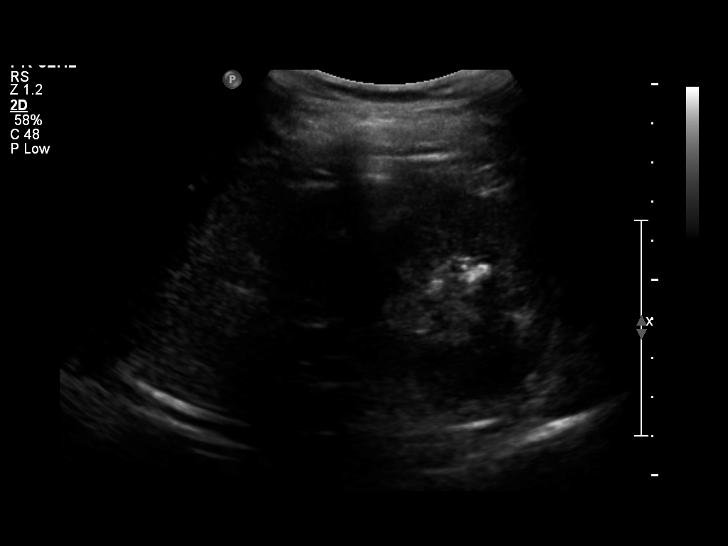
[im 33/36]
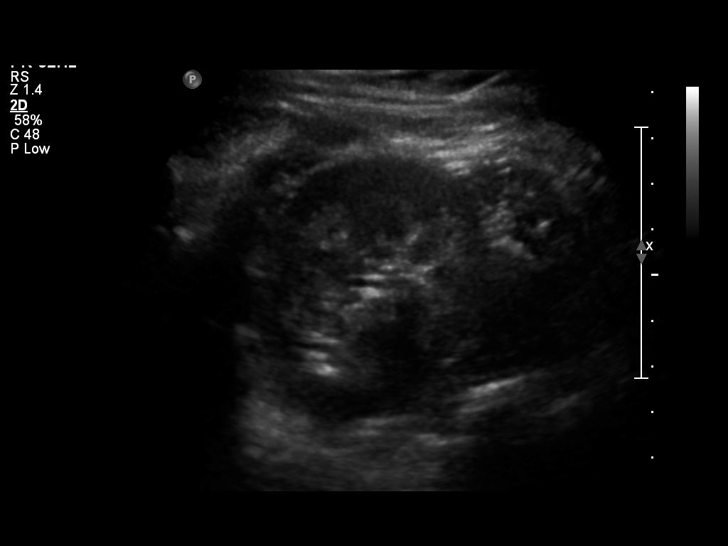
[im 36/36]
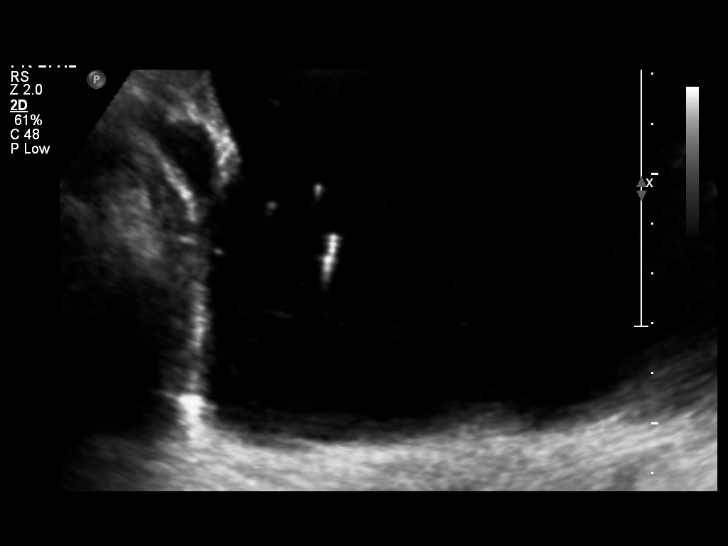

[14 of 25 positions shown; findings below may reference images not displayed]

FINDINGS: Right Kidney:  Measures 12.3 cm.  There is a 4 mm echogenic focus
with twinkle artifact in the interpolar region. Mild caliectasis
without overt hydronephrosis.

Left Kidney:  Measures 11.5 cm.  There is a cyst versus calyceal
diverticulum containing several small echogenic foci without
shadowing.  These may represent small stones.  No hydronephrosis.

Bladder:  The bladder is distended.  There are a few echogenic foci
within the bladder, which may represent recently passed stone.
IMPRESSION: Mild right-sided caliectasis without overt hydronephrosis.  There
are nonobstructing right renal stones and echogenic foci within the
bladder which may represent passed calculi as well.

On the left, there is a cyst versus dilated calix containing
echogenic foci which may represent stones.  No pelvicaliectasis on
the left.

## 2011-12-15 ENCOUNTER — Encounter (HOSPITAL_COMMUNITY)
Admission: RE | Admit: 2011-12-15 | Discharge: 2011-12-15 | Disposition: A | Discharge status: Home / Self Care | Payer: BC Managed Care – PPO | Source: Ambulatory Visit

## 2011-12-15 DIAGNOSIS — O923 Agalactia: Secondary | ICD-10-CM | POA: Insufficient documentation

## 2013-03-06 NOTE — L&D Delivery Note (Signed)
Delivery Note  First Stage: Labor onset: 1326 Augmentation: AROM Analgesia /Anesthesia intrapartum: epidural AROM at 1608, clear  Second Stage: Complete dilation at 1701 Onset of pushing at 1701 FHR second stage 135, mod variability, variables w/pushing  Delivery of a viable female at 43 by CNM in OA position w/left arm compound presentation No nuchal cord Cord double clamped after cessation of pulsation, cut by FOB Cord blood sample collected   Collection of cord blood donation n/a Arterial cord blood sample n/a  Third Stage: Placenta delivered via Delena Bali intact with 3 VC @ 1715 Placenta disposition: routine disposal Uterine tone firm initially then became boggy with brisk bleeding-Cytotec 800 mcg PR placed and fundal massage-improvement of uterine tone w/i few minutes and small bleeding  2nd degree laceration identified  Anesthesia for repair: regional Repaired with 3-0 Vicryl rapide Est. Blood Loss (mL): 683  Complications: none  Mom to postpartum.  Baby to Couplet care / Skin to Skin.  Newborn: Birth Weight: pending  Apgar Scores: 8/9 Feeding planned: breast  Julianne Handler, N MSN, CNM 11/22/2013, 5:39 PM

## 2013-04-30 LAB — OB RESULTS CONSOLE GC/CHLAMYDIA
CHLAMYDIA, DNA PROBE: NEGATIVE
Gonorrhea: NEGATIVE

## 2013-04-30 LAB — OB RESULTS CONSOLE HIV ANTIBODY (ROUTINE TESTING): HIV: NONREACTIVE

## 2013-04-30 LAB — OB RESULTS CONSOLE RPR: RPR: NONREACTIVE

## 2013-08-27 ENCOUNTER — Encounter (HOSPITAL_COMMUNITY): Payer: Self-pay | Admitting: *Deleted

## 2013-08-27 ENCOUNTER — Inpatient Hospital Stay (HOSPITAL_COMMUNITY)
Admission: AD | Admit: 2013-08-27 | Discharge: 2013-08-27 | Disposition: A | Discharge status: Home / Self Care | Payer: Managed Care, Other (non HMO) | Source: Ambulatory Visit | Attending: Obstetrics & Gynecology | Admitting: Obstetrics & Gynecology

## 2013-08-27 DIAGNOSIS — O219 Vomiting of pregnancy, unspecified: Secondary | ICD-10-CM

## 2013-08-27 DIAGNOSIS — Z87891 Personal history of nicotine dependence: Secondary | ICD-10-CM | POA: Insufficient documentation

## 2013-08-27 DIAGNOSIS — O212 Late vomiting of pregnancy: Secondary | ICD-10-CM | POA: Insufficient documentation

## 2013-08-27 DIAGNOSIS — O26833 Pregnancy related renal disease, third trimester: Secondary | ICD-10-CM

## 2013-08-27 DIAGNOSIS — N200 Calculus of kidney: Secondary | ICD-10-CM

## 2013-08-27 MED ORDER — PROMETHAZINE HCL 25 MG/ML IJ SOLN
12.5000 mg | Freq: Once | INTRAMUSCULAR | Status: AC
  Administered 2013-08-27: 12.5 mg via INTRAVENOUS
  Filled 2013-08-27: qty 1

## 2013-08-27 MED ORDER — PROMETHAZINE HCL 12.5 MG RE SUPP: 12.5000 mg | Freq: Four times a day (QID) | RECTAL | Status: DC | PRN

## 2013-08-27 MED ORDER — LACTATED RINGERS IV BOLUS (SEPSIS)
1000.0000 mL | Freq: Once | INTRAVENOUS | Status: AC
  Administered 2013-08-27: 1000 mL via INTRAVENOUS

## 2013-08-27 MED ORDER — BUTORPHANOL TARTRATE 1 MG/ML IJ SOLN
2.0000 mg | Freq: Once | INTRAMUSCULAR | Status: AC
  Administered 2013-08-27: 2 mg via INTRAVENOUS
  Filled 2013-08-27: qty 2

## 2013-08-27 NOTE — MAU Note (Signed)
Pt reports she was in the doctor's office today and had back pain and blood in her urine, was given pain meds but has been vomiting and is unable to keep anything down. Was told to come here.

## 2013-08-27 NOTE — Discharge Instructions (Signed)
Dietary Guidelines to Help Prevent Kidney Stones Your risk of kidney stones can be decreased by adjusting the foods you eat. The most important thing you can do is drink enough fluid. You should drink enough fluid to keep your urine clear or pale yellow. The following guidelines provide specific information for the type of kidney stone you have had. GUIDELINES ACCORDING TO TYPE OF KIDNEY STONE Calcium Oxalate Kidney Stones  Reduce the amount of salt you eat. Foods that have a lot of salt cause your body to release excess calcium into your urine. The excess calcium can combine with a substance called oxalate to form kidney stones.  Reduce the amount of animal protein you eat if the amount you eat is excessive. Animal protein causes your body to release excess calcium into your urine. Ask your dietitian how much protein from animal sources you should be eating.  Avoid foods that are high in oxalates. If you take vitamins, they should have less than 500 mg of vitamin C. Your body turns vitamin C into oxalates. You do not need to avoid fruits and vegetables high in vitamin C. Calcium Phosphate Kidney Stones  Reduce the amount of salt you eat to help prevent the release of excess calcium into your urine.  Reduce the amount of animal protein you eat if the amount you eat is excessive. Animal protein causes your body to release excess calcium into your urine. Ask your dietitian how much protein from animal sources you should be eating.  Get enough calcium from food or take a calcium supplement (ask your dietitian for recommendations). Food sources of calcium that do not increase your risk of kidney stones include:  Broccoli.  Dairy products, such as cheese and yogurt.  Pudding. Uric Acid Kidney Stones  Do not have more than 6 oz of animal protein per day. FOOD SOURCES Animal Protein Sources  Meat (all types).  Poultry.  Eggs.  Fish, seafood. Foods High in [employer] seasonings.  Soy  sauce.  Teriyaki sauce.  Cured and processed meats.  Salted crackers and snack foods.  Fast food.  Canned soups and most canned foods. Foods High in Oxalates  Grains:  Amaranth.  Barley.  Grits.  Wheat germ.  Bran.  Buckwheat flour.  All bran cereals.  Pretzels.  Whole wheat bread.  Vegetables:  Beans (wax).  Beets and beet greens.  Collard greens.  Eggplant.  Escarole.  Leeks.  Okra.  Parsley.  Rutabagas.  Spinach.  Swiss chard.  Tomato paste.  Fried potatoes.  Sweet potatoes.  Fruits:  Red currants.  Figs.  Kiwi.  Rhubarb.  Meat and Other Protein Sources:  Beans (dried).  Soy burgers and other soybean products.  Miso.  Nuts (peanuts, almonds, pecans, cashews, hazelnuts).  Nut butters.  Sesame seeds and tahini (paste made of sesame seeds).  Poppy seeds.  Beverages:  Chocolate drink mixes.  Soy milk.  Instant iced tea.  Juices made from high-oxalate fruits or vegetables.  Other:  Carob.  Chocolate.  Fruitcake.  Marmalades.  Document Released: 06/17/2010 Document Revised: 02/25/2013 Document Reviewed: 01/17/2013 [facility] Patient Information 2015 [place], [place]. This information is not intended to replace advice given to you by your health care provider. Make sure you discuss any questions you have with your health care provider.  Urine Strainer This strainer is used to catch or filter out any stones found in your urine. Place the strainer under your urine stream. Save any stones or objects that you find in your urine.  Place them in a plastic or glass container to show your caregiver. The stones vary in size - some can be very small, so make sure you check the strainer carefully. Your caregiver may send the stone to the lab. When the results are back, your caregiver may recommend medicines or diet changes.   Document Released: 11/26/2003 Document Revised: 05/15/2011 Document Reviewed:  01/03/2008 [facility] Patient Information 2015 [place], [place]. This information is not intended to replace advice given to you by your health care provider. Make sure you discuss any questions you have with your health care provider.

## 2013-08-27 NOTE — MAU Provider Note (Signed)
  History     CSN: [id number]  Arrival date and time: 08/27/13 1952 Orders entered in EPIC: 1953 Provider on unit: 2010 Provider at bedside: 2028      Chief Complaint  Patient presents with  . Emesis  . Nephrolithiasis   HPI  Ms. Destiny Wells is a 38 yo G2P1001 female at [redacted] wks gestation presenting tonight with complaints of N/V (unable to keep down liquids since leaving doctor's office today) and pain from kidney stones.  No VB or LOF. (+) FM. H/O kidney stones.  Past Medical History  Diagnosis Date  . Hematuria 11/03/2010    History reviewed. No pertinent past surgical history.  History reviewed. No pertinent family history.  History  Substance Use Topics  . Smoking status: Former Smoker    Types: Cigarettes  . Smokeless tobacco: Not on file     Comment: quit 15 years ago  . Alcohol Use: No    Allergies: No Known Allergies  Prescriptions prior to admission  Medication Sig Dispense Refill  . Prenatal Vit-Fe Fumarate-FA (PRENATAL MULTIVITAMIN) TABS tablet Take 1 tablet by mouth daily at 12 noon.        Review of Systems  Constitutional: Negative.   HENT: Negative.   Eyes: Negative.   Respiratory: Negative.   Cardiovascular: Negative.   Gastrointestinal: Positive for nausea and vomiting.       None since arrival at hospital  Genitourinary: Positive for hematuria and flank pain.  Musculoskeletal: Positive for back pain.  Skin: Negative.   Neurological: Negative.   Endo/Heme/Allergies: Negative.   Psychiatric/Behavioral: Negative.   FHR: 150 / moderate variability / accels present / no decels Toco: none Physical Exam   Blood pressure 120/66, pulse 78, temperature 97.4 F (36.3 C), temperature source Oral, resp. rate 18, SpO2 99.00%, unknown if currently breastfeeding.  Physical Exam  Constitutional: She is oriented to person, place, and time. She appears well-developed and well-nourished.  HENT:  Head: Normocephalic and atraumatic.  Eyes: Pupils are  equal, round, and reactive to light.  Neck: Normal range of motion. Neck supple.  Cardiovascular: Normal rate, regular rhythm and normal heart sounds.   Respiratory: Effort normal and breath sounds normal.  GI: Soft. Bowel sounds are normal.  Genitourinary:  Soft; gravid; non-tender  Musculoskeletal: Normal range of motion.  Neurological: She is alert and oriented to person, place, and time. She has normal reflexes.  Skin: Skin is warm and dry.  Psychiatric: She has a normal mood and affect. Her behavior is normal. Judgment and thought content normal.  VE: deferred  MAU Course  Procedures NST LR bolus Stadol 2 mg IVF Phenergan 12.5 mg slow IVP Strain Every Urine Assessment and Plan  38 yo G2P1001 @ [redacted] wks gestation Kidney Stone Nausea and Vomiting  Discharge Home after completion of IVF Take Percocet 1-2 tabs every 4-6 hours prn pain (previously prescribed) Phenergan 12.5 mg 1 tab po prn N/V, Disp #12, no RF Call the office, if unable to keep any fluids down x 2 hours Keep scheduled appointment   Laury Deep, M MSN, CNM 08/27/2013, 8:32 PM

## 2013-08-28 ENCOUNTER — Inpatient Hospital Stay (HOSPITAL_COMMUNITY)
Admission: AD | Admit: 2013-08-28 | Discharge: 2013-08-30 | DRG: 781 | Disposition: A | Discharge status: Home / Self Care | Payer: Managed Care, Other (non HMO) | Source: Ambulatory Visit | Attending: Obstetrics | Admitting: Obstetrics

## 2013-08-28 ENCOUNTER — Encounter (HOSPITAL_COMMUNITY): Payer: Self-pay | Admitting: *Deleted

## 2013-08-28 ENCOUNTER — Inpatient Hospital Stay (HOSPITAL_COMMUNITY): Payer: Managed Care, Other (non HMO)

## 2013-08-28 DIAGNOSIS — N200 Calculus of kidney: Secondary | ICD-10-CM

## 2013-08-28 DIAGNOSIS — N1330 Unspecified hydronephrosis: Secondary | ICD-10-CM | POA: Diagnosis present

## 2013-08-28 DIAGNOSIS — O212 Late vomiting of pregnancy: Secondary | ICD-10-CM | POA: Diagnosis present

## 2013-08-28 DIAGNOSIS — O99891 Other specified diseases and conditions complicating pregnancy: Secondary | ICD-10-CM

## 2013-08-28 DIAGNOSIS — Z87891 Personal history of nicotine dependence: Secondary | ICD-10-CM

## 2013-08-28 DIAGNOSIS — R109 Unspecified abdominal pain: Secondary | ICD-10-CM

## 2013-08-28 DIAGNOSIS — O26832 Pregnancy related renal disease, second trimester: Secondary | ICD-10-CM

## 2013-08-28 DIAGNOSIS — O4400 Placenta previa specified as without hemorrhage, unspecified trimester: Secondary | ICD-10-CM | POA: Diagnosis present

## 2013-08-28 DIAGNOSIS — Q619 Cystic kidney disease, unspecified: Secondary | ICD-10-CM

## 2013-08-28 DIAGNOSIS — O09529 Supervision of elderly multigravida, unspecified trimester: Secondary | ICD-10-CM | POA: Diagnosis present

## 2013-08-28 DIAGNOSIS — O26833 Pregnancy related renal disease, third trimester: Secondary | ICD-10-CM

## 2013-08-28 DIAGNOSIS — K5900 Constipation, unspecified: Secondary | ICD-10-CM | POA: Diagnosis present

## 2013-08-28 DIAGNOSIS — O26839 Pregnancy related renal disease, unspecified trimester: Principal | ICD-10-CM | POA: Diagnosis present

## 2013-08-28 LAB — URINALYSIS, ROUTINE W REFLEX MICROSCOPIC
Bilirubin Urine: NEGATIVE
GLUCOSE, UA: NEGATIVE mg/dL
KETONES UR: 15 mg/dL — AB
Leukocytes, UA: NEGATIVE
NITRITE: NEGATIVE
PH: 7.5 (ref 5.0–8.0)
Protein, ur: NEGATIVE mg/dL
SPECIFIC GRAVITY, URINE: 1.015 (ref 1.005–1.030)
Urobilinogen, UA: 0.2 mg/dL (ref 0.0–1.0)

## 2013-08-28 LAB — URINE MICROSCOPIC-ADD ON

## 2013-08-28 LAB — CBC
HEMATOCRIT: 29.5 % — AB (ref 36.0–46.0)
HEMOGLOBIN: 10.2 g/dL — AB (ref 12.0–15.0)
MCH: 30.6 pg (ref 26.0–34.0)
MCHC: 34.6 g/dL (ref 30.0–36.0)
MCV: 88.6 fL (ref 78.0–100.0)
Platelets: 177 10*3/uL (ref 150–400)
RBC: 3.33 MIL/uL — AB (ref 3.87–5.11)
RDW: 13.1 % (ref 11.5–15.5)
WBC: 13.7 10*3/uL — ABNORMAL HIGH (ref 4.0–10.5)

## 2013-08-28 MED ORDER — ACETAMINOPHEN 500 MG PO TABS: 1000.0000 mg | ORAL_TABLET | Freq: Four times a day (QID) | ORAL | Status: DC | PRN

## 2013-08-28 MED ORDER — PRENATAL MULTIVITAMIN CH
1.0000 | ORAL_TABLET | Freq: Every day | ORAL | Status: DC
  Administered 2013-08-29 – 2013-08-30 (×2): 1 via ORAL
  Filled 2013-08-28 (×2): qty 1

## 2013-08-28 MED ORDER — BUTORPHANOL TARTRATE 1 MG/ML IJ SOLN
1.0000 mg | Freq: Once | INTRAMUSCULAR | Status: AC
  Administered 2013-08-28: 1 mg via INTRAVENOUS
  Filled 2013-08-28: qty 1

## 2013-08-28 MED ORDER — BUTORPHANOL TARTRATE 1 MG/ML IJ SOLN
2.0000 mg | INTRAMUSCULAR | Status: DC | PRN
  Administered 2013-08-28 (×2): 2 mg via INTRAVENOUS
  Filled 2013-08-28 (×2): qty 2

## 2013-08-28 MED ORDER — BUTORPHANOL TARTRATE 1 MG/ML IJ SOLN
2.0000 mg | INTRAMUSCULAR | Status: DC | PRN
  Administered 2013-08-28 – 2013-08-29 (×3): 2 mg via INTRAVENOUS
  Filled 2013-08-28 (×4): qty 2

## 2013-08-28 MED ORDER — PROMETHAZINE HCL 25 MG/ML IJ SOLN
12.5000 mg | Freq: Four times a day (QID) | INTRAMUSCULAR | Status: DC | PRN
  Administered 2013-08-30: 12.5 mg via INTRAVENOUS
  Filled 2013-08-28: qty 1

## 2013-08-28 MED ORDER — CALCIUM CARBONATE ANTACID 500 MG PO CHEW
2.0000 | CHEWABLE_TABLET | ORAL | Status: DC | PRN
  Filled 2013-08-28: qty 2

## 2013-08-28 MED ORDER — NITROFURANTOIN MACROCRYSTAL 100 MG PO CAPS
100.0000 mg | ORAL_CAPSULE | Freq: Two times a day (BID) | ORAL | Status: DC
  Filled 2013-08-28 (×2): qty 1

## 2013-08-28 MED ORDER — NITROFURANTOIN MONOHYD MACRO 100 MG PO CAPS
100.0000 mg | ORAL_CAPSULE | Freq: Two times a day (BID) | ORAL | Status: DC
  Administered 2013-08-28 – 2013-08-30 (×4): 100 mg via ORAL
  Filled 2013-08-28 (×6): qty 1

## 2013-08-28 MED ORDER — LACTATED RINGERS IV SOLN
INTRAVENOUS | Status: DC
  Administered 2013-08-28 – 2013-08-29 (×5): via INTRAVENOUS
  Administered 2013-08-29: 150 mL/h via INTRAVENOUS
  Administered 2013-08-29 – 2013-08-30 (×3): via INTRAVENOUS

## 2013-08-28 MED ORDER — LACTATED RINGERS IV BOLUS (SEPSIS)
1000.0000 mL | Freq: Once | INTRAVENOUS | Status: AC
  Administered 2013-08-28: 1000 mL via INTRAVENOUS

## 2013-08-28 MED ORDER — ZOLPIDEM TARTRATE 5 MG PO TABS
5.0000 mg | ORAL_TABLET | Freq: Every evening | ORAL | Status: DC | PRN
  Filled 2013-08-28: qty 1

## 2013-08-28 MED ORDER — PROMETHAZINE HCL 25 MG/ML IJ SOLN
12.5000 mg | Freq: Once | INTRAMUSCULAR | Status: AC
  Administered 2013-08-28: 12.5 mg via INTRAVENOUS
  Filled 2013-08-28: qty 1

## 2013-08-28 NOTE — MAU Provider Note (Signed)
History     CSN: [id number]  Arrival date and time: 08/28/13 1303 Provider notified of pt arrival @1324  Provider at bedside @1351     Chief Complaint  Patient presents with  . Nephrolithiasis   HPI Comments: G2P1001 @27 .1 wks c/o intermittent right flank pain since yesterday. Was seen in office yesterday with suspected nephrolithiasis-flank pain and hematuria, attempted outpt management but pain became unmanageable, began vomiting and was subsequently seen in MAU last night for IV fluids and IV pain management. Flank pain and N/V became worse today around noon. Denies hematuria and dysuria today. Good FM, no LOF, cramping, or ctx. Denies fever or chills. H/o nephrolithiasis in G1. Current pregnancy complicated by low-lying placenta.   OB History   Grav Para Term Preterm Abortions TAB SAB Ect Mult Living   2 1 1       1       Past Medical History  Diagnosis Date  . Hematuria 11/03/2010    History reviewed. No pertinent past surgical history.  History reviewed. No pertinent family history.  History  Substance Use Topics  . Smoking status: Former Smoker    Types: Cigarettes  . Smokeless tobacco: Not on file     Comment: quit 15 years ago  . Alcohol Use: No    Allergies: No Known Allergies  Prescriptions prior to admission  Medication Sig Dispense Refill  . nitrofurantoin (MACRODANTIN) 100 MG capsule Take 100 mg by mouth 2 (two) times daily. 7 day dose started 08-27-13      . oxyCODONE-acetaminophen (PERCOCET/ROXICET) 5-325 MG per tablet Take 1 tablet by mouth every 4 (four) hours as needed for severe pain.      . Prenatal Vit-Fe Fumarate-FA (PRENATAL MULTIVITAMIN) TABS tablet Take 1 tablet by mouth daily at 12 noon.      . promethazine (PHENERGAN) 12.5 MG suppository Place 1 suppository (12.5 mg total) rectally every 6 (six) hours as needed for nausea or vomiting.  12 each  0    Review of Systems  Constitutional: Negative.   HENT: Negative.   Eyes: Negative.    Respiratory: Negative.   Cardiovascular: Negative.   Gastrointestinal: Positive for heartburn, nausea and vomiting.  Genitourinary: Positive for flank pain. Negative for dysuria, urgency, frequency and hematuria.  Musculoskeletal: Negative.   Skin: Negative.   Neurological: Negative.   Endo/Heme/Allergies: Negative.   Psychiatric/Behavioral: Negative.    Physical Exam   Blood pressure 117/69, pulse 83, temperature 98.2 F (36.8 C), resp. rate 18, unknown if currently breastfeeding.  Physical Exam  Constitutional: She is oriented to person, place, and time. She appears well-developed and well-nourished.  Appears uncomfortable  HENT:  Head: Normocephalic and atraumatic.  Neck: Normal range of motion.  Cardiovascular: Normal rate and regular rhythm.   Respiratory: Effort normal and breath sounds normal.  Rt flank tenderness No CVAT  GI: Soft. Bowel sounds are normal.  Genitourinary:  deferred  Musculoskeletal: Normal range of motion.  Neurological: She is alert and oriented to person, place, and time.  Skin: Skin is warm and dry.  Psychiatric: She has a normal mood and affect.  EFM: 150 bpm, mod variability, +accels, no decels Toco: none SVE: 0/0/-4  MAU Course  Procedures IVF bolus IV Stadol and Phenergan, some effect pain decreased to 5 from 10.  Renal sono: EXAM:  RENAL/URINARY TRACT ULTRASOUND COMPLETE  COMPARISON: None.  FINDINGS:  Right Kidney:  Length: 14.4 cm. Echogenicity within normal limits. Mild  hydronephrosis. No evidence of masses nor sonographically  appreciable calculi.  Left  Kidney:  Length: 30.6 cm. Echogenicity within normal limits. Benign appearing  1 cm cyst upper pole and 2.5 cm midpole cyst. No solid mass or  hydronephrosis visualized.  Bladder:  Appears normal for degree of bladder distention. Bilateral ureteral  jets.  IMPRESSION:  Mild hydronephrosis right kidney. The patient is [redacted] weeks pregnant  and this may represent sequela of  pregnancy. Bilateral ureteral jets  are appreciated and a partially obstructing distal calculus cannot  be excluded.  Benign cyst left kidney.  Electronically Signed  By: Margaree Mackintosh M.D.  On: 08/28/2013 14:46  UA: pending UC: pending (from office)  Assessment and Plan  27.[redacted] weeks gestation Right flank pain Suspect nephrolithiasis  Admit for observation, IV hydration and pain management, strain urine. Assessment and plan discussed with Dr. Pamala Hurry.   BHAMBRI, Burnett, N 08/28/2013, 2:20 PM

## 2013-08-28 NOTE — H&P (Signed)
Pt seen, uncomfortable. Needs additional pain meds.   Otherwise agree w/ above.  Will increase narcotic frequency, cont macrobid until UCx returns, add ambien at night.   FOGLEMAN,KELLY A. 08/28/2013 8:02 PM

## 2013-08-28 NOTE — Progress Notes (Signed)
Julianne Handler CNM notified of pt's arrival and complaints, orders received

## 2013-08-28 NOTE — MAU Note (Signed)
Pt presents to MAU with complaints of being diagnosed with a kidney stone yesterday and was evaluated in MAU around 7 last night. States that she was given oral pain meds for pain and she is not able to keep them down at this time

## 2013-08-28 NOTE — H&P (Signed)
  OB ADMISSION/ HISTORY & PHYSICAL:  Admission Date: 08/28/2013  1:04 PM  Admit Diagnosis: 27.[redacted] weeks gestation, flank pain, suspected nephrolithiasis  Destiny Wells is a 38 y.o. female presenting for intermittent right flank pain. Was seen in office yesterday with suspected nephrolithiasis, attempted outpt management but pain became unmanageable, began vomiting and was subsequently seen in MAU last night for IV fluids and IV pain management. Presents again today with intermittent flank pain, nausea and vomiting.   Prenatal History: G2P1001   EDC:11/26/2013, by Other Basis   Prenatal care at [facility]  Primary Ob Provider: Julianne Handler, CNM Prenatal course complicated by low-lying placenta  Prenatal Labs: ABO, Rh:   B Neg Antibody:  Neg Rubella:   Immune RPR:   NR HBsAg:   Neg HIV:   Neg GBS:   n/a 1 hr GTT : n/a  Medical / Surgical History :  Past medical history:  Past Medical History  Diagnosis Date  . Hematuria 11/03/2010     Past surgical history: History reviewed. No pertinent past surgical history.  Family History: History reviewed. No pertinent family history.   Social History:  reports that she has quit smoking. Her smoking use included Cigarettes. She smoked 0.00 packs per day. She does not have any smokeless tobacco history on file. She reports that she does not drink alcohol or use illicit drugs.  Allergies: Review of patient's allergies indicates no known allergies.   Current Medications at time of admission:  Prior to Admission medications   Medication Sig Start Date End Date Taking? Authorizing Provider  nitrofurantoin (MACRODANTIN) 100 MG capsule Take 100 mg by mouth 2 (two) times daily. 7 day dose started 08-27-13   Yes Historical Provider, MD  oxyCODONE-acetaminophen (PERCOCET/ROXICET) 5-325 MG per tablet Take 1 tablet by mouth every 4 (four) hours as needed for severe pain.   Yes Historical Provider, MD  Prenatal Vit-Fe Fumarate-FA  (PRENATAL MULTIVITAMIN) TABS tablet Take 1 tablet by mouth daily at 12 noon.   Yes Historical Provider, MD  promethazine (PHENERGAN) 12.5 MG suppository Place 1 suppository (12.5 mg total) rectally every 6 (six) hours as needed for nausea or vomiting. 08/27/13  Yes Rolitta Octaviano Glow, CNM     Review of Systems: +flank pain No fever No chills No hematuria No ctx No LOF No VB +FM  Physical Exam:  VS: Blood pressure 117/69, pulse 83, temperature 98.2 F (36.8 C), resp. rate 18, unknown if currently breastfeeding.  General: alert and oriented, appears uncomfortable Heart: RRR Lungs: Clear lung fields Abdomen: Gravid, soft and non-tender, non-distended / uterus: gravid, non-tender Extremities: No edema Genitalia / VE: 0/0/-4 FHR: baseline rate 150 / variability mod / accelerations + / no decelerations TOCO: none Sono: negative except mild rt hydronephrosis and 1cm cyst on left kidney UC: pending (from office) UA: pending  Assessment: 27.[redacted] weeks gestation Flank pain Suspect nephrolithiasis-no evidence of pyelo or PTL  Plan:  Admit to observation for IV hydration and IV pain management, strain all urine. Dr. Pamala Hurry notified of admission / plan of care   Graciela Husbands MSN, CNM 08/28/2013, 3:31 PM

## 2013-08-29 MED ORDER — OXYCODONE-ACETAMINOPHEN 5-325 MG PO TABS
2.0000 | ORAL_TABLET | ORAL | Status: DC | PRN
Start: 2013-08-29 — End: 2013-08-30
  Administered 2013-08-29 – 2013-08-30 (×4): 2 via ORAL
  Filled 2013-08-29 (×4): qty 2

## 2013-08-29 MED ORDER — ALPRAZOLAM 0.5 MG PO TABS: 0.5000 mg | ORAL_TABLET | ORAL | Status: DC | PRN

## 2013-08-29 NOTE — Progress Notes (Signed)
ANTEPARTUM NOTE - Hospital Day 2  Subjective: Reports feeling no better - intermittent waves of severe sharp flank pain (r) Tolerating po intake this morning / some mild nausea / no vomiting  Voiding QS with some hesitancy this AM - urine dark yellow per patient / no blood today Report no bleeding Fetal activity +       Objective: Vital signs: VS: Blood pressure 117/67, pulse 97, temperature 98.4 F (36.9 C), temperature source Oral, resp. rate 18, height 5' 6"  (1.676 m), weight 67.132 kg (148 lb), unknown if currently breastfeeding.  Labs: urine culture at office pending results today   I&O net positive 1484  Physical exam:        General appearance/behavior: malaise and uncomfortable - slightly lethargic from recent analgesia       Heart: RR       Lungs clear       Abdomen: soft and non-tender       Extremities: no edeam  Fetal Assessment:  FHR 150 Toco - occasional UI   SONO - mild right pyelectasis - no identified stones / normal left kidney  Assessment: [redacted] weeks gestation Right flank pain - suspect nephrolithiasis Unresolved pain   Plan:  Increase IVF hydration today - urine still appears concentrated and net positive Lemonade PO Pain management Strain urine  Dr Maudry Diego updated with patient status / plan of care  Artelia Laroche CNM, MSN, [facility] 08/29/2013, 10:15 AM

## 2013-08-30 DIAGNOSIS — O26839 Pregnancy related renal disease, unspecified trimester: Secondary | ICD-10-CM

## 2013-08-30 DIAGNOSIS — N200 Calculus of kidney: Secondary | ICD-10-CM | POA: Diagnosis present

## 2013-08-30 MED ORDER — PRENATAL MULTIVITAMIN CH: 1.0000 | ORAL_TABLET | Freq: Every day | ORAL | Status: DC

## 2013-08-30 MED ORDER — DOCUSATE SODIUM 100 MG PO CAPS
100.0000 mg | ORAL_CAPSULE | Freq: Every day | ORAL | Status: DC
  Administered 2013-08-30: 100 mg via ORAL
  Filled 2013-08-30: qty 1

## 2013-08-30 MED ORDER — ZOLPIDEM TARTRATE 5 MG PO TABS: 5.0000 mg | ORAL_TABLET | Freq: Every evening | ORAL | Status: DC | PRN

## 2013-08-30 NOTE — Progress Notes (Signed)
Passed stone. Notes some graininess in urine. Other than sore feels fine. Agree with discharge. Will send stone for f/u and have pt f/u with CNM next week. Advised to cont drinking lemonade. To cal if recurrence of pain

## 2013-08-30 NOTE — Progress Notes (Signed)
ANTEPARTUM NOTE - Hospital Day 2  Subjective: Reports feeling ok, c/o abdominal bloating, no BM x3 days Tolerating po intake / no nausea / no vomiting  Voiding QS Report no bleeding Fetal activity +     Intermittent rt flank pain, worsened last night  Objective: Vital signs: VS: Blood pressure 99/58, pulse 81, temperature 98.2 F (36.8 C), temperature source Oral, resp. rate 20, height 5' 6"  (1.676 m), weight 67.132 kg (148 lb), SpO2 99.00%, unknown if currently breastfeeding.  Labs: UC negative (from office)  Physical exam:       General appearance/behavior: alert and oriented x3       Heart: RRR       Lungs CTA bilat       Abdomen: soft, non-tender, gravid       Extremities: no edema, Homans negative  Fetal Assessment:  FHR 140 bpm, mod variability, +accels, no decels TOCO: irritability  Assessment: 27.[redacted] weeks gestation Flank pain Suspect nephrolithiasis Pain uncontrolled Constipation  Plan:  Continue IV hydration, continue straining urine, Percocet prn, Colace daily, warm prune or apple juice, ambulate, encourage lemonade intake.  Dr Garwin Brothers updated with patient status / plan of care  Destiny Husbands MSN, CNM 08/30/2013, 8:47 AM

## 2013-08-30 NOTE — Discharge Instructions (Signed)
Fetal Movement Counts Patient Name: __________________________________________________ Patient Due Date: ____________________ Performing a fetal movement count is highly recommended in high-risk pregnancies, but it is good for every pregnant woman to do. Your caregiver may ask you to start counting fetal movements at 28 weeks of the pregnancy. Fetal movements often increase:  After eating a full meal.  After physical activity.  After eating or drinking something sweet or cold.  At rest. Pay attention to when you feel the baby is most active. This will help you notice a pattern of your baby's sleep and wake cycles and what factors contribute to an increase in fetal movement. It is important to perform a fetal movement count at the same time each day when your baby is normally most active.  HOW TO COUNT FETAL MOVEMENTS 1. Find a quiet and comfortable area to sit or lie down on your left side. Lying on your left side provides the best blood and oxygen circulation to your baby. 2. Write down the day and time on a sheet of paper or in a journal. 3. Start counting kicks, flutters, swishes, rolls, or jabs in a 2 hour period. You should feel at least 10 movements within 2 hours. 4. If you do not feel 10 movements in 2 hours, wait 2-3 hours and count again. Look for a change in the pattern or not enough counts in 2 hours. SEEK MEDICAL CARE IF:  You feel less than 10 counts in 2 hours, tried twice.  There is no movement in over an hour.  The pattern is changing or taking longer each day to reach 10 counts in 2 hours.  You feel the baby is not moving as he or she usually does. Date: ____________ Movements: ____________ Start time: ____________ Elizebeth Koller time: ____________  Date: ____________ Movements: ____________ Start time: ____________ Elizebeth Koller time: ____________ Date: ____________ Movements: ____________ Start time: ____________ Elizebeth Koller time: ____________ Date: ____________ Movements:  ____________ Start time: ____________ Elizebeth Koller time: ____________ Date: ____________ Movements: ____________ Start time: ____________ Elizebeth Koller time: ____________ Date: ____________ Movements: ____________ Start time: ____________ Elizebeth Koller time: ____________ Date: ____________ Movements: ____________ Start time: ____________ Elizebeth Koller time: ____________ Date: ____________ Movements: ____________ Start time: ____________ Elizebeth Koller time: ____________  Date: ____________ Movements: ____________ Start time: ____________ Elizebeth Koller time: ____________ Date: ____________ Movements: ____________ Start time: ____________ Elizebeth Koller time: ____________ Date: ____________ Movements: ____________ Start time: ____________ Elizebeth Koller time: ____________ Date: ____________ Movements: ____________ Start time: ____________ Elizebeth Koller time: ____________ Date: ____________ Movements: ____________ Start time: ____________ Elizebeth Koller time: ____________ Date: ____________ Movements: ____________ Start time: ____________ Elizebeth Koller time: ____________ Date: ____________ Movements: ____________ Start time: ____________ Elizebeth Koller time: ____________  Date: ____________ Movements: ____________ Start time: ____________ Elizebeth Koller time: ____________ Date: ____________ Movements: ____________ Start time: ____________ Elizebeth Koller time: ____________ Date: ____________ Movements: ____________ Start time: ____________ Elizebeth Koller time: ____________ Date: ____________ Movements: ____________ Start time: ____________ Elizebeth Koller time: ____________ Date: ____________ Movements: ____________ Start time: ____________ Elizebeth Koller time: ____________ Date: ____________ Movements: ____________ Start time: ____________ Elizebeth Koller time: ____________ Date: ____________ Movements: ____________ Start time: ____________ Elizebeth Koller time: ____________  Date: ____________ Movements: ____________ Start time: ____________ Elizebeth Koller time: ____________ Date: ____________ Movements: ____________ Start time: ____________ Elizebeth Koller  time: ____________ Date: ____________ Movements: ____________ Start time: ____________ Elizebeth Koller time: ____________ Date: ____________ Movements: ____________ Start time: ____________ Elizebeth Koller time: ____________ Date: ____________ Movements: ____________ Start time: ____________ Elizebeth Koller time: ____________ Date: ____________ Movements: ____________ Start time: ____________ Elizebeth Koller time: ____________ Date: ____________ Movements: ____________ Start time: ____________ Elizebeth Koller time: ____________  Date: ____________ Movements: ____________ Start time: ____________ Elizebeth Koller  time: ____________ Date: ____________ Movements: ____________ Start time: ____________ Elizebeth Koller time: ____________ Date: ____________ Movements: ____________ Start time: ____________ Elizebeth Koller time: ____________ Date: ____________ Movements: ____________ Start time: ____________ Elizebeth Koller time: ____________ Date: ____________ Movements: ____________ Start time: ____________ Elizebeth Koller time: ____________ Date: ____________ Movements: ____________ Start time: ____________ Elizebeth Koller time: ____________ Date: ____________ Movements: ____________ Start time: ____________ Elizebeth Koller time: ____________  Date: ____________ Movements: ____________ Start time: ____________ Elizebeth Koller time: ____________ Date: ____________ Movements: ____________ Start time: ____________ Elizebeth Koller time: ____________ Date: ____________ Movements: ____________ Start time: ____________ Elizebeth Koller time: ____________ Date: ____________ Movements: ____________ Start time: ____________ Elizebeth Koller time: ____________ Date: ____________ Movements: ____________ Start time: ____________ Elizebeth Koller time: ____________ Date: ____________ Movements: ____________ Start time: ____________ Elizebeth Koller time: ____________ Date: ____________ Movements: ____________ Start time: ____________ Elizebeth Koller time: ____________  Date: ____________ Movements: ____________ Start time: ____________ Elizebeth Koller time: ____________ Date: ____________  Movements: ____________ Start time: ____________ Elizebeth Koller time: ____________ Date: ____________ Movements: ____________ Start time: ____________ Elizebeth Koller time: ____________ Date: ____________ Movements: ____________ Start time: ____________ Elizebeth Koller time: ____________ Date: ____________ Movements: ____________ Start time: ____________ Elizebeth Koller time: ____________ Date: ____________ Movements: ____________ Start time: ____________ Elizebeth Koller time: ____________ Date: ____________ Movements: ____________ Start time: ____________ Elizebeth Koller time: ____________  Date: ____________ Movements: ____________ Start time: ____________ Elizebeth Koller time: ____________ Date: ____________ Movements: ____________ Start time: ____________ Elizebeth Koller time: ____________ Date: ____________ Movements: ____________ Start time: ____________ Elizebeth Koller time: ____________ Date: ____________ Movements: ____________ Start time: ____________ Elizebeth Koller time: ____________ Date: ____________ Movements: ____________ Start time: ____________ Elizebeth Koller time: ____________ Date: ____________ Movements: ____________ Start time: ____________ Elizebeth Koller time: ____________ Document Released: 03/22/2006 Document Revised: 02/07/2012 Document Reviewed: 12/18/2011 [employer] Patient Information 2015 [employer]. This information is not intended to replace advice given to you by your health care provider. Make sure you discuss any questions you have with your health care provider. Kidney Stones Kidney stones (urolithiasis) are deposits that form inside your kidneys. The intense pain is caused by the stone moving through the urinary tract. When the stone moves, the ureter goes into spasm around the stone. The stone is usually passed in the urine.  CAUSES   A disorder that makes certain neck glands produce too much parathyroid hormone (primary hyperparathyroidism).  A buildup of uric acid crystals, similar to gout in your joints.  Narrowing (stricture) of the ureter.  A kidney  obstruction present at birth (congenital obstruction).  Previous surgery on the kidney or ureters.  Numerous kidney infections. SYMPTOMS   Feeling sick to your stomach (nauseous).  Throwing up (vomiting).  Blood in the urine (hematuria).  Pain that usually spreads (radiates) to the groin.  Frequency or urgency of urination. DIAGNOSIS   Taking a history and physical exam.  Blood or urine tests.  CT scan.  Occasionally, an examination of the inside of the urinary bladder (cystoscopy) is performed. TREATMENT   Observation.  Increasing your fluid intake.  Extracorporeal shock wave lithotripsy--This is a noninvasive procedure that uses shock waves to break up kidney stones.  Surgery may be needed if you have severe pain or persistent obstruction. There are various surgical procedures. Most of the procedures are performed with the use of small instruments. Only small incisions are needed to accommodate these instruments, so recovery time is minimized. The size, location, and chemical composition are all important variables that will determine the proper choice of action for you. Talk to your health care provider to better understand your situation so that you will minimize the risk of injury to yourself and your kidney.  HOME CARE INSTRUCTIONS  Drink enough water and fluids to keep your urine clear or pale yellow. This will help you to pass the stone or stone fragments.  Strain all urine through the provided strainer. Keep all particulate matter and stones for your health care provider to see. The stone causing the pain may be as small as a grain of salt. It is very important to use the strainer each and every time you pass your urine. The collection of your stone will allow your health care provider to analyze it and verify that a stone has actually passed. The stone analysis will often identify what you can do to reduce the incidence of recurrences.  Only take over-the-counter  or prescription medicines for pain, discomfort, or fever as directed by your health care provider.  Make a follow-up appointment with your health care provider as directed.  Get follow-up X-rays if required. The absence of pain does not always mean that the stone has passed. It may have only stopped moving. If the urine remains completely obstructed, it can cause loss of kidney function or even complete destruction of the kidney. It is your responsibility to make sure X-rays and follow-ups are completed. Ultrasounds of the kidney can show blockages and the status of the kidney. Ultrasounds are not associated with any radiation and can be performed easily in a matter of minutes. SEEK MEDICAL CARE IF:  You experience pain that is progressive and unresponsive to any pain medicine you have been prescribed. SEEK IMMEDIATE MEDICAL CARE IF:   Pain cannot be controlled with the prescribed medicine.  You have a fever or shaking chills.  The severity or intensity of pain increases over 18 hours and is not relieved by pain medicine.  You develop a new onset of abdominal pain.  You feel faint or pass out.  You are unable to urinate. MAKE SURE YOU:   Understand these instructions.  Will watch your condition.  Will get help right away if you are not doing well or get worse. Document Released: 02/20/2005 Document Revised: 10/23/2012 Document Reviewed: 07/24/2012 [facility] Patient Information 2015 [place], [place]. This information is not intended to replace advice given to you by your health care provider. Make sure you discuss any questions you have with your health care provider.

## 2013-09-01 NOTE — Progress Notes (Signed)
Ur chart review completed.

## 2013-09-04 LAB — STONE ANALYSIS: STONE WEIGHT KSTONE: 0.051 g

## 2013-09-07 NOTE — Discharge Summary (Signed)
Obstetric Discharge Summary Reason for Admission: Right flank pain, IUP @ 27 1/7 weeks, kidney stone Discharge diagnosis: nephrolithiasis, IUP @ 27 3/7 weeks, Prenatal Procedures: ultrasound Intrapartum Procedures: IVF, pain medication urine straining Postpartum Procedures: n/a Complications-Operative and Postpartum: n/a Hemoglobin  Date Value Ref Range Status  08/28/2013 10.2* 12.0 - 15.0 g/dL Final     HCT  Date Value Ref Range Status  08/28/2013 29.5* 36.0 - 46.0 % Final    Physical Exam:  General: alert, cooperative and no distress See earlier progress note Hospital course: pt was admitted for IV fluid, lemonade and pain management. Pt passed stone on D3 after fluid given. Stone sent for analysis.  Discharge Diagnoses: Nephrolithiasis, IUP @ 27 3/7 weeks  Discharge Information: Date: 08/30/2013 Activity: unrestricted Diet: routine Medications: PNV, percocet, phenergan Condition: stable Instructions: continue increase fluid intake Discharge to: home Keep scheduled OB appt  Destiny Wells A 09/07/2013, 9:16 AM

## 2013-11-05 LAB — OB RESULTS CONSOLE GBS: GBS: NEGATIVE

## 2013-11-22 ENCOUNTER — Encounter (HOSPITAL_COMMUNITY): Payer: Self-pay

## 2013-11-22 ENCOUNTER — Encounter (HOSPITAL_COMMUNITY): Payer: Managed Care, Other (non HMO) | Admitting: Anesthesiology

## 2013-11-22 ENCOUNTER — Inpatient Hospital Stay (HOSPITAL_COMMUNITY)
Admission: AD | Admit: 2013-11-22 | Discharge: 2013-11-23 | DRG: 775 | Disposition: A | Discharge status: Home / Self Care | Payer: Managed Care, Other (non HMO) | Source: Ambulatory Visit | Attending: Obstetrics and Gynecology | Admitting: Obstetrics and Gynecology

## 2013-11-22 ENCOUNTER — Inpatient Hospital Stay (HOSPITAL_COMMUNITY): Payer: Managed Care, Other (non HMO) | Admitting: Anesthesiology

## 2013-11-22 DIAGNOSIS — O328XX0 Maternal care for other malpresentation of fetus, not applicable or unspecified: Secondary | ICD-10-CM | POA: Diagnosis present

## 2013-11-22 DIAGNOSIS — Z8249 Family history of ischemic heart disease and other diseases of the circulatory system: Secondary | ICD-10-CM | POA: Diagnosis not present

## 2013-11-22 DIAGNOSIS — D509 Iron deficiency anemia, unspecified: Secondary | ICD-10-CM | POA: Diagnosis present

## 2013-11-22 DIAGNOSIS — O9903 Anemia complicating the puerperium: Secondary | ICD-10-CM | POA: Diagnosis present

## 2013-11-22 DIAGNOSIS — O09529 Supervision of elderly multigravida, unspecified trimester: Secondary | ICD-10-CM | POA: Diagnosis present

## 2013-11-22 DIAGNOSIS — Z87891 Personal history of nicotine dependence: Secondary | ICD-10-CM

## 2013-11-22 DIAGNOSIS — O9902 Anemia complicating childbirth: Secondary | ICD-10-CM | POA: Diagnosis present

## 2013-11-22 DIAGNOSIS — O479 False labor, unspecified: Secondary | ICD-10-CM | POA: Diagnosis present

## 2013-11-22 DIAGNOSIS — D62 Acute posthemorrhagic anemia: Secondary | ICD-10-CM | POA: Diagnosis present

## 2013-11-22 DIAGNOSIS — O360990 Maternal care for other rhesus isoimmunization, unspecified trimester, not applicable or unspecified: Secondary | ICD-10-CM | POA: Diagnosis present

## 2013-11-22 DIAGNOSIS — N200 Calculus of kidney: Secondary | ICD-10-CM | POA: Diagnosis present

## 2013-11-22 HISTORY — DX: Anemia, unspecified: D64.9

## 2013-11-22 LAB — CBC
HCT: 33.5 % — ABNORMAL LOW (ref 36.0–46.0)
Hemoglobin: 11.6 g/dL — ABNORMAL LOW (ref 12.0–15.0)
MCH: 29.5 pg (ref 26.0–34.0)
MCHC: 34.6 g/dL (ref 30.0–36.0)
MCV: 85.2 fL (ref 78.0–100.0)
PLATELETS: 216 10*3/uL (ref 150–400)
RBC: 3.93 MIL/uL (ref 3.87–5.11)
RDW: 13.9 % (ref 11.5–15.5)
WBC: 9.7 10*3/uL (ref 4.0–10.5)

## 2013-11-22 LAB — RPR

## 2013-11-22 MED ORDER — DIBUCAINE 1 % RE OINT: 1.0000 "application " | TOPICAL_OINTMENT | RECTAL | Status: DC | PRN

## 2013-11-22 MED ORDER — OXYCODONE-ACETAMINOPHEN 5-325 MG PO TABS: 1.0000 | ORAL_TABLET | ORAL | Status: DC | PRN

## 2013-11-22 MED ORDER — FERROUS SULFATE 325 (65 FE) MG PO TABS
325.0000 mg | ORAL_TABLET | Freq: Two times a day (BID) | ORAL | Status: DC
  Administered 2013-11-23: 325 mg via ORAL
  Filled 2013-11-22: qty 1

## 2013-11-22 MED ORDER — DIPHENHYDRAMINE HCL 25 MG PO CAPS: 25.0000 mg | ORAL_CAPSULE | Freq: Four times a day (QID) | ORAL | Status: DC | PRN

## 2013-11-22 MED ORDER — OXYTOCIN 10 UNIT/ML IJ SOLN: 10.0000 [IU] | Freq: Once | INTRAMUSCULAR | Status: DC

## 2013-11-22 MED ORDER — TERBUTALINE SULFATE 1 MG/ML IJ SOLN: 0.2500 mg | Freq: Once | INTRAMUSCULAR | Status: DC | PRN

## 2013-11-22 MED ORDER — BENZOCAINE-MENTHOL 20-0.5 % EX AERO
1.0000 "application " | INHALATION_SPRAY | CUTANEOUS | Status: DC | PRN
  Administered 2013-11-23: 1 via TOPICAL
  Filled 2013-11-22: qty 56

## 2013-11-22 MED ORDER — MEASLES, MUMPS & RUBELLA VAC ~~LOC~~ INJ
0.5000 mL | INJECTION | Freq: Once | SUBCUTANEOUS | Status: DC
  Filled 2013-11-22: qty 0.5

## 2013-11-22 MED ORDER — OXYTOCIN 40 UNITS IN LACTATED RINGERS INFUSION - SIMPLE MED: 62.5000 mL/h | INTRAVENOUS | Status: DC

## 2013-11-22 MED ORDER — PRENATAL MULTIVITAMIN CH
1.0000 | ORAL_TABLET | Freq: Every day | ORAL | Status: DC
  Administered 2013-11-23: 1 via ORAL
  Filled 2013-11-22: qty 1

## 2013-11-22 MED ORDER — ONDANSETRON HCL 4 MG/2ML IJ SOLN: 4.0000 mg | INTRAMUSCULAR | Status: DC | PRN

## 2013-11-22 MED ORDER — EPHEDRINE 5 MG/ML INJ
10.0000 mg | INTRAVENOUS | Status: DC | PRN
  Filled 2013-11-22: qty 2

## 2013-11-22 MED ORDER — OXYCODONE-ACETAMINOPHEN 5-325 MG PO TABS: 2.0000 | ORAL_TABLET | ORAL | Status: DC | PRN

## 2013-11-22 MED ORDER — FENTANYL 2.5 MCG/ML BUPIVACAINE 1/10 % EPIDURAL INFUSION (WH - ANES)
INTRAMUSCULAR | Status: AC
  Administered 2013-11-22: 14 mL/h via EPIDURAL
  Filled 2013-11-22: qty 125

## 2013-11-22 MED ORDER — PHENYLEPHRINE 40 MCG/ML (10ML) SYRINGE FOR IV PUSH (FOR BLOOD PRESSURE SUPPORT)
PREFILLED_SYRINGE | INTRAVENOUS | Status: DC
Start: 2013-11-22 — End: 2013-11-22
  Filled 2013-11-22: qty 10

## 2013-11-22 MED ORDER — MAGNESIUM OXIDE 400 (241.3 MG) MG PO TABS
200.0000 mg | ORAL_TABLET | Freq: Every day | ORAL | Status: DC
  Filled 2013-11-22 (×3): qty 0.5

## 2013-11-22 MED ORDER — PHENYLEPHRINE 40 MCG/ML (10ML) SYRINGE FOR IV PUSH (FOR BLOOD PRESSURE SUPPORT)
80.0000 ug | PREFILLED_SYRINGE | INTRAVENOUS | Status: DC | PRN
  Filled 2013-11-22: qty 2

## 2013-11-22 MED ORDER — EPHEDRINE 5 MG/ML INJ
10.0000 mg | INTRAVENOUS | Status: DC | PRN
Start: 2013-11-22 — End: 2013-11-22
  Filled 2013-11-22: qty 2

## 2013-11-22 MED ORDER — WITCH HAZEL-GLYCERIN EX PADS: 1.0000 "application " | MEDICATED_PAD | CUTANEOUS | Status: DC | PRN

## 2013-11-22 MED ORDER — MISOPROSTOL 200 MCG PO TABS
ORAL_TABLET | ORAL | Status: AC
Start: 2013-11-22 — End: 2013-11-22
  Administered 2013-11-22: 800 ug
  Filled 2013-11-22: qty 4

## 2013-11-22 MED ORDER — LACTATED RINGERS IV SOLN: 500.0000 mL | Freq: Once | INTRAVENOUS | Status: DC

## 2013-11-22 MED ORDER — IBUPROFEN 600 MG PO TABS
600.0000 mg | ORAL_TABLET | Freq: Four times a day (QID) | ORAL | Status: DC
  Administered 2013-11-22 – 2013-11-23 (×4): 600 mg via ORAL
  Filled 2013-11-22 (×4): qty 1

## 2013-11-22 MED ORDER — SENNOSIDES-DOCUSATE SODIUM 8.6-50 MG PO TABS
2.0000 | ORAL_TABLET | ORAL | Status: DC
  Administered 2013-11-23: 2 via ORAL
  Filled 2013-11-22: qty 2

## 2013-11-22 MED ORDER — ONDANSETRON HCL 4 MG PO TABS: 4.0000 mg | ORAL_TABLET | ORAL | Status: DC | PRN

## 2013-11-22 MED ORDER — OXYTOCIN 40 UNITS IN LACTATED RINGERS INFUSION - SIMPLE MED
1.0000 m[IU]/min | INTRAVENOUS | Status: DC
  Filled 2013-11-22: qty 1000

## 2013-11-22 MED ORDER — FENTANYL 2.5 MCG/ML BUPIVACAINE 1/10 % EPIDURAL INFUSION (WH - ANES): 14.0000 mL/h | INTRAMUSCULAR | Status: DC | PRN

## 2013-11-22 MED ORDER — MISOPROSTOL 200 MCG PO TABS: 800.0000 ug | ORAL_TABLET | Freq: Once | ORAL | Status: DC

## 2013-11-22 MED ORDER — LACTATED RINGERS IV SOLN: INTRAVENOUS | Status: DC

## 2013-11-22 MED ORDER — DIPHENHYDRAMINE HCL 50 MG/ML IJ SOLN: 12.5000 mg | INTRAMUSCULAR | Status: DC | PRN

## 2013-11-22 MED ORDER — FENTANYL 2.5 MCG/ML BUPIVACAINE 1/10 % EPIDURAL INFUSION (WH - ANES)
14.0000 mL/h | INTRAMUSCULAR | Status: DC | PRN
  Administered 2013-11-22: 14 mL/h via EPIDURAL

## 2013-11-22 MED ORDER — LIDOCAINE HCL (PF) 1 % IJ SOLN
30.0000 mL | INTRAMUSCULAR | Status: DC | PRN
  Filled 2013-11-22: qty 30

## 2013-11-22 MED ORDER — LANOLIN HYDROUS EX OINT: TOPICAL_OINTMENT | CUTANEOUS | Status: DC | PRN

## 2013-11-22 MED ORDER — TETANUS-DIPHTH-ACELL PERTUSSIS 5-2.5-18.5 LF-MCG/0.5 IM SUSP: 0.5000 mL | Freq: Once | INTRAMUSCULAR | Status: DC

## 2013-11-22 MED ORDER — SIMETHICONE 80 MG PO CHEW: 80.0000 mg | CHEWABLE_TABLET | ORAL | Status: DC | PRN

## 2013-11-22 MED ORDER — LIDOCAINE HCL (PF) 1 % IJ SOLN
INTRAMUSCULAR | Status: DC | PRN
  Administered 2013-11-22: 10 mL

## 2013-11-22 MED ORDER — LACTATED RINGERS IV SOLN: 500.0000 mL | INTRAVENOUS | Status: DC | PRN

## 2013-11-22 MED ORDER — CITRIC ACID-SODIUM CITRATE 334-500 MG/5ML PO SOLN: 30.0000 mL | ORAL | Status: DC | PRN

## 2013-11-22 MED ORDER — ONDANSETRON HCL 4 MG/2ML IJ SOLN: 4.0000 mg | Freq: Four times a day (QID) | INTRAMUSCULAR | Status: DC | PRN

## 2013-11-22 MED ORDER — OXYTOCIN BOLUS FROM INFUSION
500.0000 mL | INTRAVENOUS | Status: DC
  Administered 2013-11-22: 500 mL via INTRAVENOUS

## 2013-11-22 NOTE — Progress Notes (Signed)
Subjective:   Comfortable with epidural.  Objective:   VS: Blood pressure 137/80, pulse 73, temperature 97.6 F (36.4 C), temperature source Oral, resp. rate 18, height 5' 6"  (1.676 m), weight 72.576 kg (160 lb), SpO2 100.00%, not currently breastfeeding. FHR: baseline 135 / variability mod / accelerations + / no decelerations Toco: contractions every 3-4 minutes Cervix: Dilation: 6 Effacement (%): 90 Station: -2 Exam by:: Dakota Stangl, CNM Membranes: bulging  Assessment:  Labor: First stage, active FHR category I  Plan:  Peanut ball, change positions often, anticipate SVD.     Julianne Handler, N MSN, CNM 11/22/2013, 1:32 PM

## 2013-11-22 NOTE — Progress Notes (Signed)
Subjective:   Comfortable with epidural. Lying on right with peanutball.  Objective:   VS: Blood pressure 129/70, pulse 66, temperature 97.6 F (36.4 C), temperature source Oral, resp. rate 18, height 5' 6"  (1.676 m), weight 72.576 kg (160 lb), SpO2 100.00%, not currently breastfeeding. FHR: baseline 140 / variability mod / accelerations + / no decelerations Toco: contractions every 1-3 minutes Cervix: Dilation: 6 Effacement (%): 90 Station: -2 Exam by:: Patryck Kilgore, CNM Membranes: AROM, clear, small  Assessment:  Labor: first stage active, protracted FHR category I  Plan:  AROM for augmentation, continue reposition with peanutball, anticipate SVD.  Dr. Ronita Hipps updated with A/P.     Julianne Handler, N MSN, CNM 11/22/2013, 4:16 PM

## 2013-11-22 NOTE — Progress Notes (Signed)
Noted, pt shaking, holding baby, will retake

## 2013-11-22 NOTE — Anesthesia Preprocedure Evaluation (Signed)
Anesthesia Evaluation  Patient identified by MRN, date of birth, ID band Patient awake    Reviewed: Allergy & Precautions, H&P , Patient's Chart, lab work & pertinent test results  Airway Mallampati: II TM Distance: >3 FB Neck ROM: full    Dental  (+) Teeth Intact   Pulmonary former smoker,  breath sounds clear to auscultation        Cardiovascular Rhythm:regular Rate:Normal     Neuro/Psych    GI/Hepatic   Endo/Other    Renal/GU      Musculoskeletal   Abdominal   Peds  Hematology   Anesthesia Other Findings       Reproductive/Obstetrics (+) Pregnancy                           Anesthesia Physical Anesthesia Plan  ASA: II  Anesthesia Plan: Epidural   Post-op Pain Management:    Induction:   Airway Management Planned:   Additional Equipment:   Intra-op Plan:   Post-operative Plan:   Informed Consent: I have reviewed the patients History and Physical, chart, labs and discussed the procedure including the risks, benefits and alternatives for the proposed anesthesia with the patient or authorized representative who has indicated his/her understanding and acceptance.   Dental Advisory Given  Plan Discussed with:   Anesthesia Plan Comments: (Labs checked- platelets confirmed with RN in room. Fetal heart tracing, per RN, reported to be stable enough for sitting procedure. Discussed epidural, and patient consents to the procedure:  included risk of possible headache,backache, failed block, allergic reaction, and nerve injury. This patient was asked if she had any questions or concerns before the procedure started.)        Anesthesia Quick Evaluation

## 2013-11-22 NOTE — MAU Note (Signed)
Per pt, contractions q4 minutes apart. Denies bleeding or lof.

## 2013-11-22 NOTE — Anesthesia Procedure Notes (Signed)
Epidural Patient location during procedure: OB  Preanesthetic Checklist Completed: patient identified, site marked, surgical consent, pre-op evaluation, timeout performed, IV checked, risks and benefits discussed and monitors and equipment checked  Epidural Patient position: sitting Prep: site prepped and draped and DuraPrep Patient monitoring: continuous pulse ox and blood pressure Approach: midline Injection technique: LOR air  Needle:  Needle type: Tuohy  Needle gauge: 17 G Needle length: 9 cm and 9 Needle insertion depth: 5 cm cm Catheter type: closed end flexible Catheter size: 19 Gauge Catheter at skin depth: 10 cm Test dose: negative  Assessment Events: blood not aspirated, injection not painful, no injection resistance, negative IV test and no paresthesia  Additional Notes Dosing of Epidural:  1st dose, through catheter ............................................Marland Kitchen  Xylocaine 40 mg  2nd dose, through catheter, after waiting 3 minutes........Marland KitchenXylocaine 60 mg    ( 1% Xylo charted as a single dose in Epic Meds for ease of charting; actual dosing was fractionated as above, for saftey's sake)  As each dose occurred, patient was free of IV sx; and patient exhibited no evidence of SA injection.  Patient is more comfortable after epidural dosed. Please see RN's note for documentation of vital signs,and FHR which are stable.  Patient reminded not to try to ambulate with numb legs, and that an RN must be present when she attempts to get up.

## 2013-11-22 NOTE — H&P (Signed)
  OB ADMISSION/ HISTORY & PHYSICAL:  Admission Date: 11/22/2013 10:46 AM  Admit Diagnosis: 39.[redacted] weeks gestation, normal labor  Destiny Wells is a 38 y.o. female presenting with regular painful ctx since early this am.  Prenatal History: G2P1001   EDC:11/26/2013, by LMP  Prenatal care at [facility]  Primary Ob Provider: Julianne Handler, CNM Prenatal course complicated by nephrolithiasis @27  wks, low lying placenta resolved by 29 wks, anemia, and AMA.  Prenatal Labs: ABO, Rh:   B neg Antibody:  Neg Rubella:   Immune RPR:   NR HBsAg:   Neg HIV:   Neg GBS:   Neg 1 hr GTT : 101  Medical / Surgical History :  Past medical history:  Past Medical History  Diagnosis Date  . Hematuria 11/03/2010  . Anemia      Past surgical history:  Past Surgical History  Procedure Laterality Date  . Tonsillectomy    . Wisdom tooth extraction Bilateral     Family History:  Family History  Problem Relation Age of Onset  . Alcohol abuse Mother   . Depression Mother   . Hypertension Mother   . Cancer Maternal Grandmother   . Hypertension Maternal Grandmother   . Cancer Paternal Grandfather      Social History:  reports that she quit smoking about 15 years ago. Her smoking use included Cigarettes. She smoked 0.50 packs per day. She has never used smokeless tobacco. She reports that she does not drink alcohol or use illicit drugs.  Allergies: Review of patient's allergies indicates no known allergies.   Current Medications at time of admission:  Prior to Admission medications   Medication Sig Start Date End Date Taking? Authorizing Provider  nitrofurantoin, macrocrystal-monohydrate, (MACROBID) 100 MG capsule Take 100 mg by mouth 2 (two) times daily. 7 day course started 08-27-13.    Historical Provider, MD  oxyCODONE-acetaminophen (PERCOCET/ROXICET) 5-325 MG per tablet Take 1 tablet by mouth every 4 (four) hours as needed for severe pain.    Historical Provider, MD   Prenatal Vit-Fe Fumarate-FA (PRENATAL MULTIVITAMIN) TABS tablet Take 1 tablet by mouth daily at 12 noon.    Historical Provider, MD  promethazine (PHENERGAN) 12.5 MG suppository Place 1 suppository (12.5 mg total) rectally every 6 (six) hours as needed for nausea or vomiting. 08/27/13   Laury Deep, CNM     Review of Systems: +ctx +FM No VB No LOF  Physical Exam:  VS: Height 5' 6"  (1.676 m), weight 71.838 kg (158 lb 6 oz), not currently breastfeeding.  General: alert and oriented, appears uncomfortable w/ctx Heart: RRR Lungs: Clear lung fields Abdomen: Gravid, soft and non-tender, non-distended / uterus: gravid, non-tender Extremities: No edema Genitalia / VE: Dilation: 5 Effacement (%): 80 Station: -2 Exam by:: MBhambri, CNM EFW: 7.5 lbs FHR: baseline rate 135 / variability mod / accelerations + / no decelerations TOCO: 2-3, moderate  Assessment: 39.[redacted] weeks gestation First stage of labor, active FHR category I   Plan:  Admit, analgesia/anesthesia prn, efm per policy, anticipate SVD. Dr. Ronita Hipps notified of admission / plan of care   Destiny Husbands MSN, CNM 11/22/2013, 11:11 AM

## 2013-11-23 DIAGNOSIS — D509 Iron deficiency anemia, unspecified: Secondary | ICD-10-CM | POA: Diagnosis present

## 2013-11-23 LAB — CBC
HCT: 31 % — ABNORMAL LOW (ref 36.0–46.0)
Hemoglobin: 10.6 g/dL — ABNORMAL LOW (ref 12.0–15.0)
MCH: 29.5 pg (ref 26.0–34.0)
MCHC: 34.2 g/dL (ref 30.0–36.0)
MCV: 86.4 fL (ref 78.0–100.0)
Platelets: 213 10*3/uL (ref 150–400)
RBC: 3.59 MIL/uL — ABNORMAL LOW (ref 3.87–5.11)
RDW: 14 % (ref 11.5–15.5)
WBC: 14 10*3/uL — AB (ref 4.0–10.5)

## 2013-11-23 MED ORDER — IBUPROFEN 600 MG PO TABS: 600.0000 mg | ORAL_TABLET | Freq: Four times a day (QID) | ORAL | Status: AC

## 2013-11-23 MED ORDER — RHO D IMMUNE GLOBULIN 1500 UNIT/2ML IJ SOSY
300.0000 ug | PREFILLED_SYRINGE | Freq: Once | INTRAMUSCULAR | Status: AC
  Administered 2013-11-23: 300 ug via INTRAMUSCULAR
  Filled 2013-11-23: qty 2

## 2013-11-23 NOTE — Progress Notes (Signed)
PPD #1- SVD  Subjective:   Reports feeling well, desires early discharge Tolerating po/ No nausea or vomiting Bleeding is light Pain controlled with Motrin Up ad lib / ambulatory / voiding without problems Newborn: breast feeding  / Circumcision: not planning   Objective:   VS: VS:  Filed Vitals:   11/22/13 1955 11/22/13 2100 11/23/13 0100 11/23/13 0610  BP: 137/78 129/77 139/78 137/80  Pulse: 72 73 65 74  Temp: 98.4 F (36.9 C) 98.5 F (36.9 C) 97.8 F (36.6 C) 98.5 F (36.9 C)  TempSrc: Oral Oral Oral Oral  Resp: 18 18 18 18   Height:      Weight:      SpO2: 99% 99% 97%     LABS:  Recent Labs  11/22/13 1110 11/23/13 0638  WBC 9.7 14.0*  HGB 11.6* 10.6*  PLT 216 213   Blood type: --/--/B NEG (09/20 0638) Rubella:   Immune               I&O: Intake/Output     09/19 0701 - 09/20 0700 09/20 0701 - 09/21 0700   Urine (mL/kg/hr) 650    Blood 400    Total Output 1050     Net -1050            Physical Exam: Alert and oriented X3 Abdomen: soft, non-tender, non-distended  Fundus: firm, non-tender, U-1 Perineum: Well approximated, no significant erythema, edema, or drainage; healing well. Lochia: small Extremities: No edema, no calf pain or tenderness    Assessment: PPD #1 G2P2002/ S/P:spontaneous vaginal, 2nd degree laceration IDA with mild ABL anemia Rh negative Doing well - stable for discharge home   Plan: Discharge home Rhogam prior to discharge RX's:  Ibuprofen 656m po Q 6 hrs prn pain #30 Refill x 0 Niferex 1567mpo QD #30 Refill x 1 Routine pp visit in [facility] booklet given    BHJulianne HandlerN MSN, CNM 11/23/2013, 11:33 AM

## 2013-11-23 NOTE — Discharge Summary (Signed)
Obstetric Discharge Summary Reason for Admission: onset of labor Prenatal Course: AMA, low-lying placenta, resolved, nephrolethiasis Intrapartum Procedures: spontaneous vaginal delivery Postpartum Procedures: Rho(D) Ig Complications-Operative and Postpartum: 2nd degree perineal laceration Hemoglobin  Date Value Ref Range Status  11/23/2013 10.6* 12.0 - 15.0 g/dL Final     HCT  Date Value Ref Range Status  11/23/2013 31.0* 36.0 - 46.0 % Final    Physical Exam:  General: alert and cooperative Lochia: appropriate Uterine Fundus: firm Incision: healing well, no significant drainage, no dehiscence, no significant erythema DVT Evaluation: No evidence of DVT seen on physical exam. Negative Homan's sign. No cords or calf tenderness. No significant calf/ankle edema.  Discharge Diagnoses: Term Pregnancy-delivered  Discharge Information: Date: 11/23/2013 Activity: pelvic rest Diet: routine Medications: PNV, Ibuprofen and Iron Condition: stable Instructions: refer to practice specific booklet Discharge to: home Follow-up Information   Follow up with Destiny Wells, N, CNM. Schedule an appointment as soon as possible for a visit in 6 weeks.   Specialty:  Obstetrics and Gynecology   Contact information:   [place] [id number] [phone]       Newborn Data: Live born female on 11/22/13 Birth Weight: 8 lb 6 oz (3799 g) APGAR: 8, 9  Home with mother.  Destiny Wells, [place], N 11/23/2013, 1:40 PM

## 2013-11-23 NOTE — Anesthesia Postprocedure Evaluation (Signed)
Anesthesia Post Note  Patient: Destiny Wells  Procedure(s) Performed: * No procedures listed *  Anesthesia type: Epidural  Patient location: Mother/Baby  Post pain: Pain level controlled  Post assessment: Post-op Vital signs reviewed  Last Vitals:  Filed Vitals:   11/23/13 0610  BP: 137/80  Pulse: 74  Temp: 36.9 C  Resp: 18    Post vital signs: Reviewed  Level of consciousness:alert  Complications: No apparent anesthesia complications

## 2013-11-24 LAB — RH IG WORKUP (INCLUDES ABO/RH)
ABO/RH(D): B NEG
ANTIBODY SCREEN: POSITIVE
DAT, IgG: NEGATIVE
Fetal Screen: NEGATIVE
Gestational Age(Wks): 39
UNIT DIVISION: 0

## 2014-01-05 ENCOUNTER — Encounter (HOSPITAL_COMMUNITY): Payer: Self-pay

## 2014-09-24 IMAGING — US US RENAL
2 series · 14 of 25 positions shown · non-contrast
Comparison: None.

CLINICAL DATA: suspect kidney stone, flank pain

EXAM:
RENAL/URINARY TRACT ULTRASOUND COMPLETE

[Series 1: us renal · 1 of 3 slices shown (1 of 2)]
[im 1/3]
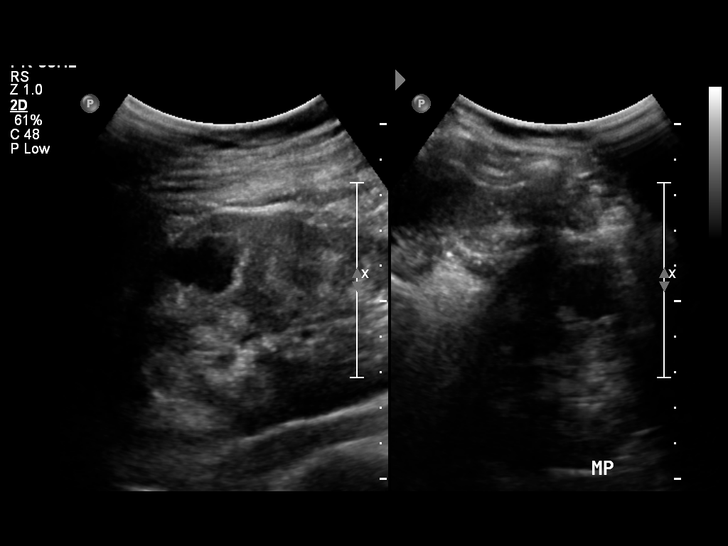

[Series 1: us renal · 13 of 35 slices shown (2 of 2)]
[im 1/35]
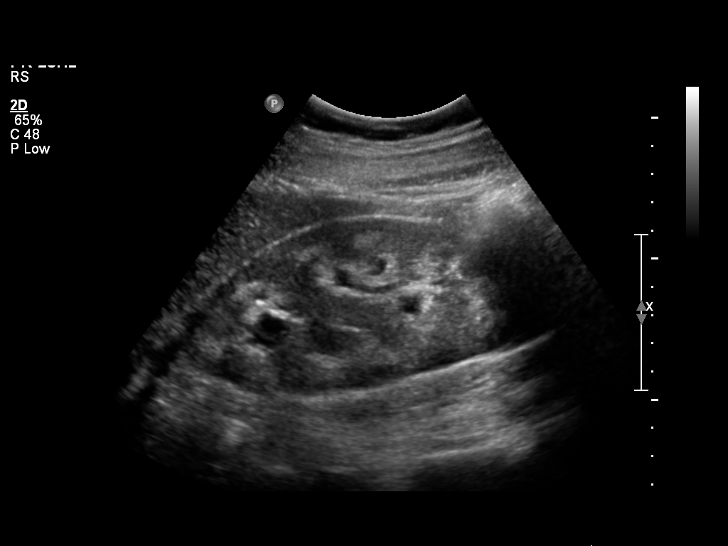
[im 4/35]
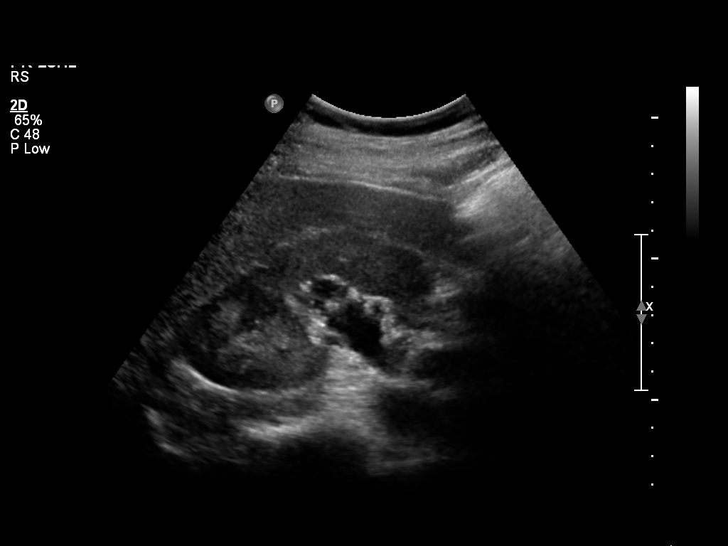
[im 7/35]
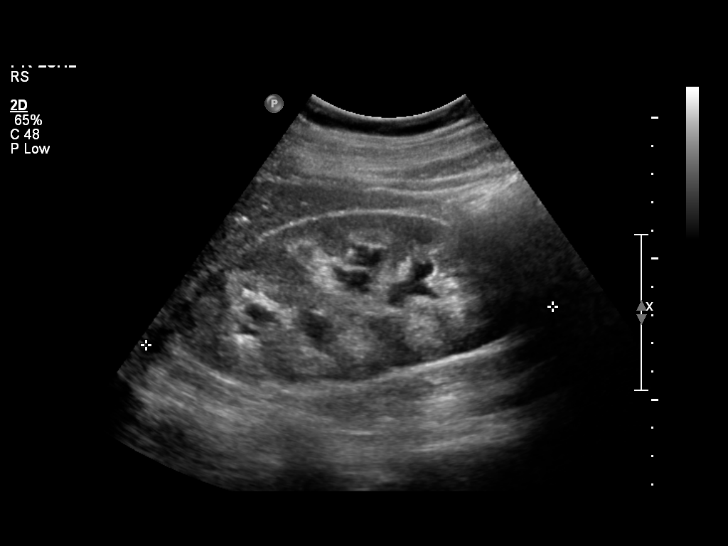
[im 10/35]
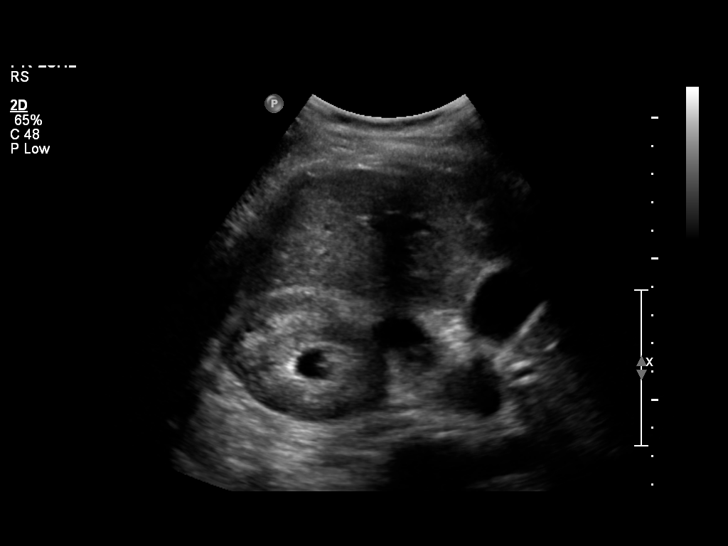
[im 11/35]
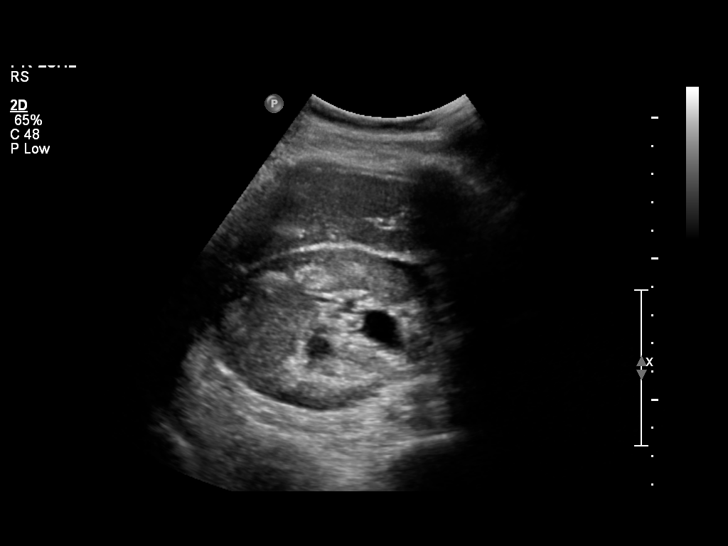
[im 14/35]
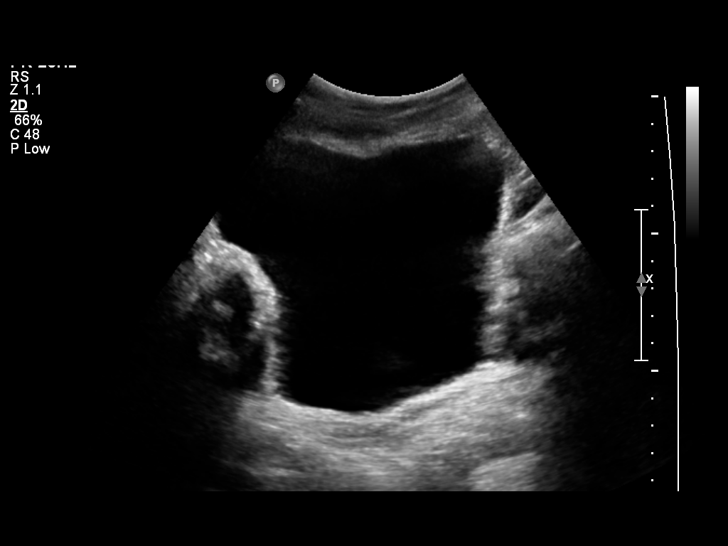
[im 18/35]
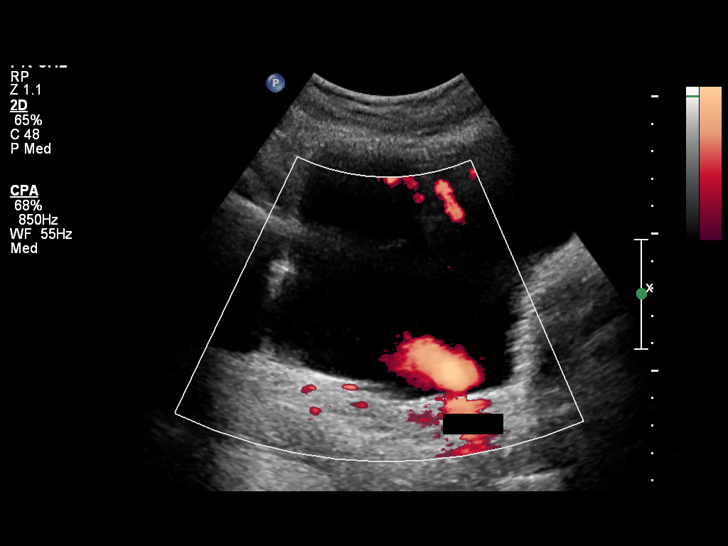
[im 21/35]
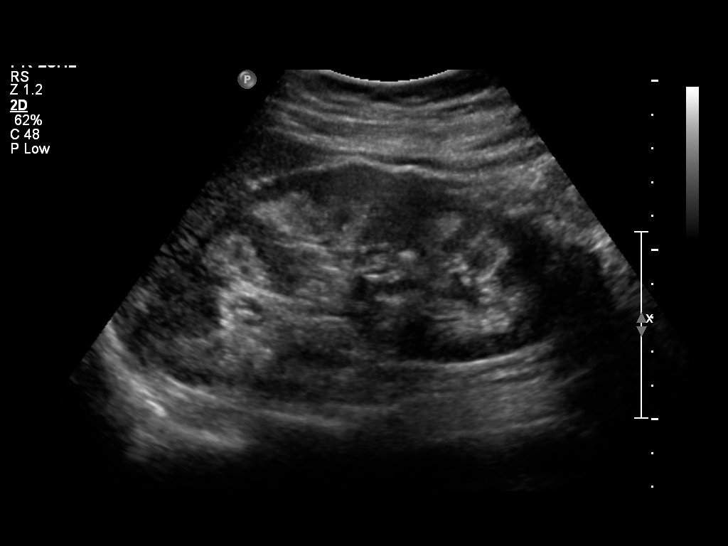
[im 22/35]
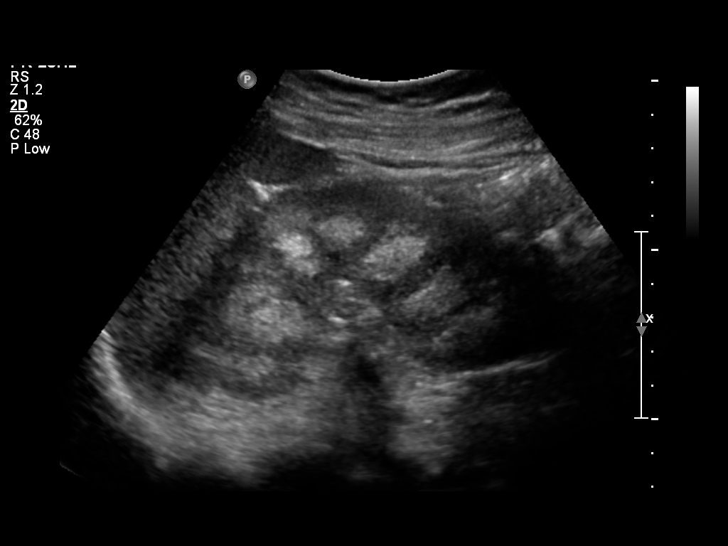
[im 25/35]
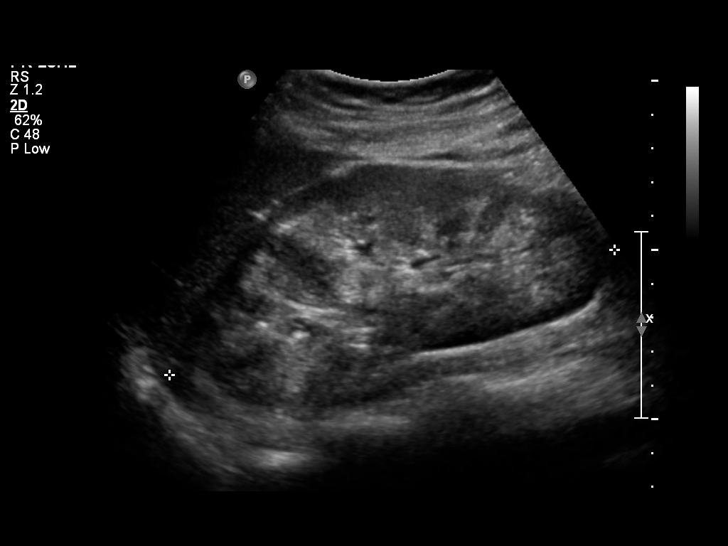
[im 28/35]
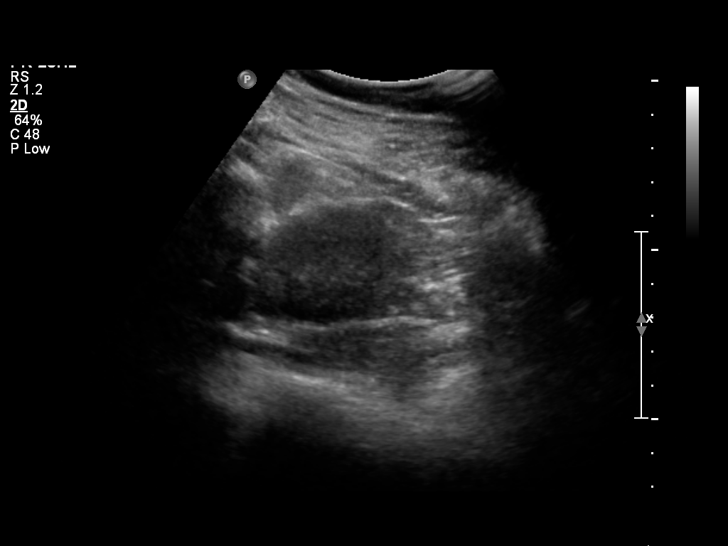
[im 31/35]
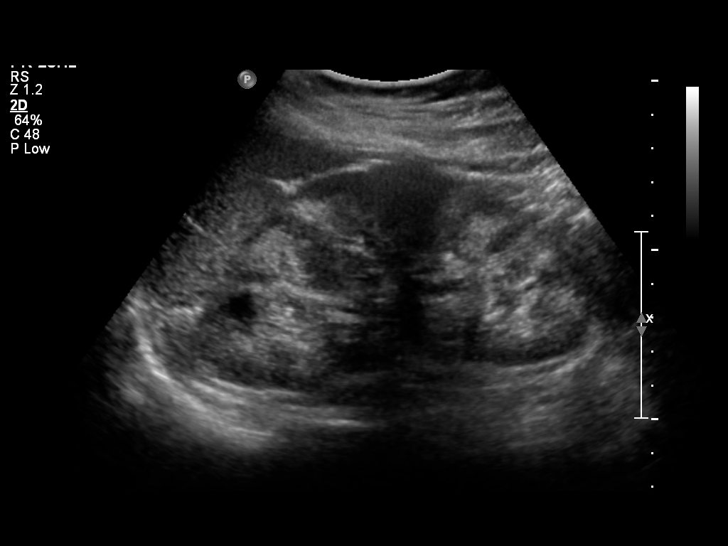
[im 35/35]
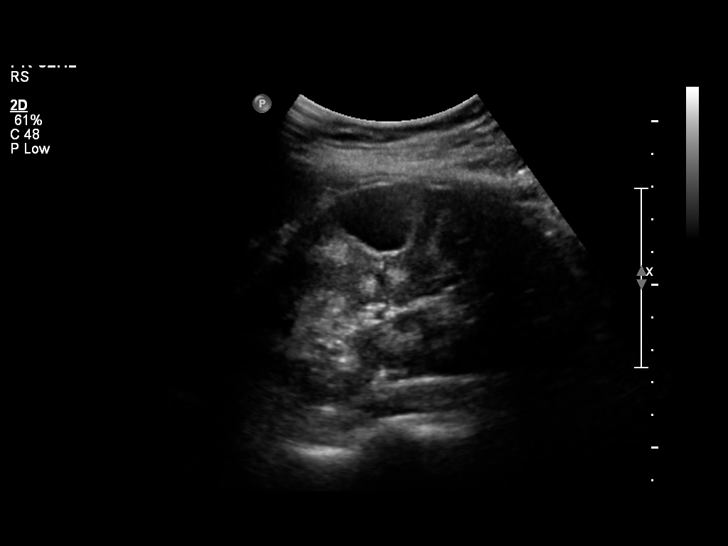

[14 of 25 positions shown; findings below may reference images not displayed]

FINDINGS: Right Kidney:

Length: 14.4 cm.. Echogenicity within normal limits. Mild
hydronephrosis. No evidence of masses nor sonographically
appreciable calculi.

Left Kidney:

Length: 30.6 cm. Echogenicity within normal limits. Benign appearing
1 cm cyst upper pole and 2.5 cm midpole cyst. No solid mass or
hydronephrosis visualized.

Bladder:

Appears normal for degree of bladder distention. Bilateral ureteral
jets.
IMPRESSION: Mild hydronephrosis right kidney. The patient is 27 weeks pregnant
and this may represent sequela of pregnancy. Bilateral ureteral jets
are appreciated and a partially obstructing distal calculus cannot
be excluded.

Benign cyst left kidney.
